# Patient Record
Sex: Female | Born: 1970
Health system: Southern US, Community
[De-identification: ages and names within clinical notes are randomized; demographics above are authoritative.]

## PROBLEM LIST (undated history)

## (undated) DIAGNOSIS — R519 Headache, unspecified: Secondary | ICD-10-CM

## (undated) DIAGNOSIS — B009 Herpesviral infection, unspecified: Secondary | ICD-10-CM

## (undated) DIAGNOSIS — M7072 Other bursitis of hip, left hip: Secondary | ICD-10-CM

## (undated) DIAGNOSIS — E559 Vitamin D deficiency, unspecified: Secondary | ICD-10-CM

## (undated) DIAGNOSIS — E538 Deficiency of other specified B group vitamins: Secondary | ICD-10-CM

## (undated) DIAGNOSIS — M2550 Pain in unspecified joint: Secondary | ICD-10-CM

## (undated) DIAGNOSIS — R748 Abnormal levels of other serum enzymes: Secondary | ICD-10-CM

## (undated) DIAGNOSIS — R51 Headache: Secondary | ICD-10-CM

## (undated) DIAGNOSIS — M7071 Other bursitis of hip, right hip: Secondary | ICD-10-CM

## (undated) DIAGNOSIS — K769 Liver disease, unspecified: Secondary | ICD-10-CM

## (undated) DIAGNOSIS — D649 Anemia, unspecified: Secondary | ICD-10-CM

## (undated) DIAGNOSIS — M797 Fibromyalgia: Secondary | ICD-10-CM

## (undated) HISTORY — DX: Pain in unspecified joint: M25.50

## (undated) HISTORY — DX: Abnormal levels of other serum enzymes: R74.8

## (undated) HISTORY — PX: NO PAST SURGERIES: SHX2092

## (undated) HISTORY — DX: Vitamin D deficiency, unspecified: E55.9

## (undated) HISTORY — DX: Liver disease, unspecified: K76.9

## (undated) HISTORY — DX: Deficiency of other specified B group vitamins: E53.8

## (undated) HISTORY — DX: Other bursitis of hip, right hip: M70.72

## (undated) HISTORY — DX: Fibromyalgia: M79.7

## (undated) HISTORY — DX: Other bursitis of hip, right hip: M70.71

---

## 1999-01-14 ENCOUNTER — Other Ambulatory Visit: Admission: RE | Admit: 1999-01-14 | Discharge: 1999-01-14 | Payer: Self-pay | Admitting: Obstetrics and Gynecology

## 1999-03-29 ENCOUNTER — Emergency Department (HOSPITAL_COMMUNITY): Admission: EM | Admit: 1999-03-29 | Discharge: 1999-03-29 | Payer: Self-pay | Admitting: Emergency Medicine

## 2000-04-02 ENCOUNTER — Encounter (INDEPENDENT_AMBULATORY_CARE_PROVIDER_SITE_OTHER): Payer: Self-pay | Admitting: Specialist

## 2000-04-02 ENCOUNTER — Other Ambulatory Visit: Admission: RE | Admit: 2000-04-02 | Discharge: 2000-04-02 | Payer: Self-pay | Admitting: *Deleted

## 2002-10-23 ENCOUNTER — Encounter: Admission: RE | Admit: 2002-10-23 | Discharge: 2002-10-23 | Payer: Self-pay | Admitting: *Deleted

## 2002-10-23 ENCOUNTER — Encounter: Payer: Self-pay | Admitting: *Deleted

## 2002-11-09 ENCOUNTER — Ambulatory Visit (HOSPITAL_COMMUNITY): Admission: RE | Admit: 2002-11-09 | Discharge: 2002-11-09 | Payer: Self-pay | Admitting: *Deleted

## 2002-11-16 ENCOUNTER — Ambulatory Visit (HOSPITAL_COMMUNITY): Admission: RE | Admit: 2002-11-16 | Discharge: 2002-11-16 | Payer: Self-pay | Admitting: *Deleted

## 2002-11-16 ENCOUNTER — Encounter: Payer: Self-pay | Admitting: *Deleted

## 2005-07-14 ENCOUNTER — Other Ambulatory Visit: Admission: RE | Admit: 2005-07-14 | Discharge: 2005-07-14 | Payer: Self-pay | Admitting: Family Medicine

## 2007-03-08 ENCOUNTER — Emergency Department (HOSPITAL_COMMUNITY): Admission: EM | Admit: 2007-03-08 | Discharge: 2007-03-08 | Payer: Self-pay | Admitting: *Deleted

## 2007-10-07 ENCOUNTER — Ambulatory Visit: Payer: Self-pay | Admitting: Internal Medicine

## 2007-10-07 LAB — CONVERTED CEMR LAB
ALT: 17 units/L (ref 0–35)
AST: 20 units/L (ref 0–37)
Albumin: 3.8 g/dL (ref 3.5–5.2)
Alkaline Phosphatase: 28 units/L — ABNORMAL LOW (ref 39–117)
Amylase: 63 units/L (ref 27–131)
BUN: 10 mg/dL (ref 6–23)
Basophils Absolute: 0 10*3/uL (ref 0.0–0.1)
Basophils Relative: 0 % (ref 0.0–1.0)
Bilirubin, Direct: 0.1 mg/dL (ref 0.0–0.3)
CO2: 31 meq/L (ref 19–32)
Calcium: 9 mg/dL (ref 8.4–10.5)
Chloride: 106 meq/L (ref 96–112)
Creatinine, Ser: 0.9 mg/dL (ref 0.4–1.2)
Eosinophils Absolute: 0 10*3/uL (ref 0.0–0.6)
Eosinophils Relative: 0.6 % (ref 0.0–5.0)
GFR calc Af Amer: 92 mL/min
GFR calc non Af Amer: 76 mL/min
Glucose, Bld: 106 mg/dL — ABNORMAL HIGH (ref 70–99)
HCT: 36.5 % (ref 36.0–46.0)
Hemoglobin: 12.1 g/dL (ref 12.0–15.0)
Lipase: 24 units/L (ref 11.0–59.0)
Lymphocytes Relative: 42.6 % (ref 12.0–46.0)
MCHC: 33.2 g/dL (ref 30.0–36.0)
MCV: 96.2 fL (ref 78.0–100.0)
Monocytes Absolute: 0.5 10*3/uL (ref 0.2–0.7)
Monocytes Relative: 9.4 % (ref 3.0–11.0)
Neutro Abs: 2.8 10*3/uL (ref 1.4–7.7)
Neutrophils Relative %: 47.4 % (ref 43.0–77.0)
Platelets: 308 10*3/uL (ref 150–400)
Potassium: 4.1 meq/L (ref 3.5–5.1)
RBC: 3.79 M/uL — ABNORMAL LOW (ref 3.87–5.11)
RDW: 11.6 % (ref 11.5–14.6)
Sodium: 140 meq/L (ref 135–145)
Total Bilirubin: 0.6 mg/dL (ref 0.3–1.2)
Total Protein: 6.6 g/dL (ref 6.0–8.3)
WBC: 5.8 10*3/uL (ref 4.5–10.5)

## 2007-10-11 ENCOUNTER — Ambulatory Visit (HOSPITAL_COMMUNITY): Admission: RE | Admit: 2007-10-11 | Discharge: 2007-10-11 | Payer: Self-pay | Admitting: Internal Medicine

## 2007-12-22 ENCOUNTER — Ambulatory Visit (HOSPITAL_COMMUNITY): Admission: RE | Admit: 2007-12-22 | Discharge: 2007-12-22 | Payer: Self-pay | Admitting: General Surgery

## 2007-12-22 ENCOUNTER — Encounter (INDEPENDENT_AMBULATORY_CARE_PROVIDER_SITE_OTHER): Payer: Self-pay | Admitting: General Surgery

## 2007-12-22 ENCOUNTER — Encounter: Payer: Self-pay | Admitting: Internal Medicine

## 2010-11-25 ENCOUNTER — Inpatient Hospital Stay (INDEPENDENT_AMBULATORY_CARE_PROVIDER_SITE_OTHER)
Admission: RE | Admit: 2010-11-25 | Discharge: 2010-11-25 | Disposition: A | Discharge status: Home / Self Care | Payer: 59 | Source: Ambulatory Visit | Attending: Family Medicine | Admitting: Family Medicine

## 2010-11-25 DIAGNOSIS — J309 Allergic rhinitis, unspecified: Secondary | ICD-10-CM

## 2010-11-25 DIAGNOSIS — J019 Acute sinusitis, unspecified: Secondary | ICD-10-CM

## 2011-01-06 NOTE — Assessment & Plan Note (Signed)
[facility]                         GASTROENTEROLOGY OFFICE NOTE   NAME:Kayla [name], Kayla [name]                        MRN:          [id number]  DATE:10/07/2007                            DOB:          17-Aug-1971    CHIEF COMPLAINT:  Abdominal pain.  History of gall stones.   HISTORY:  This is a 40 year old African-American woman who describes  intermittent epigastric pain.  It is episodic.  Any type of food will  bother her.  The pain may radiate to the infrascapular area at times.  It is not associated with heartburn, indigestion, nausea or vomiting, or  dysphagia, or bowel habits changes.  She had a workup for a similar  problem, if not the same problem, in 2004 when Dr. Virginia Rochester performed an EGD  that was normal.  She had cholelithiasis on an ultrasound in 2004.  She  is now taking numerous ibuprofen in a week to help with this pain, and  it is probably not relieving it completely.  She describes the pain  usually as a sharp cramping pain that is fairly intense.   MEDICATIONS:  Only the ibuprofen intermittently.   There are no known drug allergies.   PAST MEDICAL HISTORY:  As described above.   FAMILY HISTORY/SOCIAL HISTORY/REVIEW OF SYSTEMS:  Occasional alcohol.  No tobacco or drugs.  She is single with 2 daughters.  Review of systems  negative.   PHYSICAL EXAMINATION:  Well-developed, well-nourished overweight to  obese.  Height 5 feet 1 inches, weight 171 pounds, blood pressure 120/80, pulse  70.  EYES:  Anicteric.  ENT:  Normal mouth posterior oropharynx.  NECK:  Supple.  No thyromegaly or mass.  CHEST:  Clear.  HEART:  S1, S2.  No murmurs, rubs, or gallops.  ABDOMEN:  She has mild to moderate tenderness in the epigastrium without  rebound or mass.  LYMPHATICS:  No neck or supraclavicular nodes.  EXTREMITIES:  No edema.  SKIN:  No rash.  PSYCH:  She is alert and oriented x3.  Appropriate affect.  NEURO:  Cranial nerves 2-12 are intact.   ASSESSMENT:  Epigastric pain.  It is not classic for gall stones, but  she does have known cholelithiasis.  The question now is if she has  choledocholithiasis.  On top of this is significant NSAID use if not  abuse.  She has not had melena or bleeding problems.   PLAN:  1. Stop ibuprofen.  2. A limited supply of Vicodin is given for her pain.  3. Proton pump inhibitor empirically.  4. Ultrasound to recheck for possible choledocholithiasis.  5. Go ahead with surgical referral for this lady as she does have gall      stones.  6. I have held off on upper gastrointestinal endoscopy at this time,      as I am not sure it will add anything.  She could have NSAID      ulceration problems, et Karie Soda.  However, these are the same      problems she had in 2004 and would like the surgeons to evaluate  her to see if cholecystectomy makes sense, but will defer to their      judgment, and could perform an upper endoscopy if there are other      questions.     Iva Boop, MD,FACG  Electronically Signed    CEG/MedQ  DD: 10/11/2007  DT: 10/11/2007  Job #: [id number]   cc:   [facility]

## 2011-01-06 NOTE — Op Note (Signed)
NAMEJANNETT, SCHMALL               ACCOUNT NO.:  [id number]   MEDICAL RECORD NO.:  [id number]          PATIENT TYPE:  AMB   LOCATION:  SDS                          FACILITY:  [facility]   PHYSICIAN:  Cherylynn Ridges, M.D.    DATE OF BIRTH:  1971-05-28   DATE OF PROCEDURE:  12/22/2007  DATE OF DISCHARGE:  12/22/2007                               OPERATIVE REPORT   PREOPERATIVE DIAGNOSIS:  Symptomatic cholelithiasis.   POSTOPERATIVE DIAGNOSIS:  Symptomatic cholelithiasis.   PROCEDURE:  Laparoscopic cholecystectomy.   SURGEON:  Cherylynn Ridges, MD   ASSISTANT:  Clarisa Fling, NP   ANESTHESIA:  General endotracheal.   ESTIMATED BLOOD LOSS:  Less than 30 mL.   COMPLICATIONS:  None.   CONDITION:  Stable.   FINDINGS:  The patient had a gallbladder packed with hundreds of small  pigmented stones.  Normal cholangiogram.   INDICATIONS FOR OPERATION:  The patient is a 41 year old with  symptomatic gallstones who comes in now for laparoscopic  cholecystectomy.   OPERATION:  The patient was taken to the operating room, placed on table  in supine position.  After an adequate general endotracheal anesthetic  was administered, she was prepped and draped in usual sterile manner  exposing the midline and the right upper quadrant of her abdomen.   A supraumbilical curvilinear incision was made using a #11 blade and  taken down to the midline fascia.  The fascia was grabbed with 2 Kocher  clamps and an incision made between the clamps using a 15 blade.  This  allowed [place] to incise into the anterior fascia into the preperitoneal  space.  We subsequently bluntly dissected down into the peritoneal  cavity using a Kelly clamp as we tented up on the abdominal wall with  the Kocher clamps.  Once entering the peritoneal cavity safely, a  pursestring suture of 0 Vicryl was passed around the fascial opening and  then subsequently a Hasson cannula passed into the peritoneal cavity  with minimal difficulty.   It was secured in place with a pursestring  suture.   Carbon dioxide gas was insufflated through the supraumbilical cannula up  to a maximal intra-abdominal pressure of 50 mmHg.  Once this was done, 2  right upper quadrant 5-mm cannulas and a subxiphoid 11-12 mm cannula  passed under direct vision into the peritoneal cavity.  After all  cannulas were in place, the patient was placed in reverse Trendelenburg,  the left-side was tilted down, and the dissection begun.   The gallbladder dome was grabbed with a ratcheted grasper through the  lateral-most cannula and retracted towards the anterior abdominal wall.  A second grasper was placed on the infundibulum.  We dissected out the  peritoneum overlying the triangle of Fallot and hepatobiliary triangle.  We dissected out the cystic duct and the cystic artery placing a clip  along the gallbladder side and cystic duct and making a  cholecystogastrostomy using laparoscopic scissors.  Cholangiogram was  performed using a Cook catheter, which had been passed through the  anterior abdominal wall demonstrating good flow into the duodenum, no  intraductal filling defects, no dilatation of the common bile duct, and  good proximal flow.   Once the cholangiogram was completed, the clip was secured in place was  removed, the catheter was removed, and then we clipped the distal cystic  duct x3 using laparoscopic clips.  We transected the cystic duct, then  isolated the cystic artery, controlled it proximally and distally with  clips, and then transected with electrocautery.  We then dissected out  the gallbladder from its bed with minimal difficulty, not needing to use  an EndoCatch bag to bring it out through the supraumbilical fascial  site.   Once the gallbladder was removed, we inspected the bed which was  cauterized for control of bleeding.  There was minimal bleeding.  We  irrigated with about 750 L of saline solution.  We then aspirated all   fluid and gas from above the liver.  As this was done, we looked for  adequate hemostasis and there was.  We aspirated all fluid and gas from  above the liver as we removed all cannulas.   The supraumbilical fascial site was closed using a pursestring suture  which was in place.  We then injected 0.25% Marcaine with epinephrine at  all sites and then closed the supraumbilical and subxiphoid skin sites  using a running subcuticular stitch of 4-0 Vicryl.  Dermabond, Steri-  Strips, and Tegaderm was applied to all of the wounds.  All needle  counts, sponge counts, and instrument counts were correct.      Cherylynn Ridges, M.D.  Electronically Signed     JOW/MEDQ  D:  12/22/2007  T:  12/23/2007  Job:  [id number]   cc:   Iva Boop, MD,FACG

## 2011-01-09 NOTE — Op Note (Signed)
   NAMELAKETTA, SODERBERG                           ACCOUNT NO.:  [id number]   MEDICAL RECORD NO.:  [id number]                   PATIENT TYPE:  AMB   LOCATION:  ENDO                                 FACILITY:  [facility]   PHYSICIAN:  Georgiana Spinner, M.D.                 DATE OF BIRTH:  1971-07-27   DATE OF PROCEDURE:  11/09/2002  DATE OF DISCHARGE:                                 OPERATIVE REPORT   PROCEDURE:  Upper endoscopy.   INDICATIONS:  Abdominal pain.   ANESTHESIA:  Demerol 30 mg, Versed 3 mg.   DESCRIPTION OF PROCEDURE:  With the patient mildly sedated in the left  lateral decubitus position, the Olympus videoscopic endoscope was inserted  in the mouth, passed under direct vision through the esophagus, which  appeared normal, into the stomach.  Fundus, body, antrum, duodenal bulb,  second portion of the duodenum all appeared normal.  From this point the  endoscope was slowly withdrawn, taking circumferential views of the duodenal  mucosa until the endoscope had been pulled back in the stomach, placed in  retroflexion to view the stomach from below.  The endoscope was then  straightened and withdrawn, taking circumferential views of the remaining  gastric and esophageal mucosa.  The patient's vital signs and pulse oximetry  remained stable.  The patient tolerated the procedure well without apparent  complications.   FINDINGS:  Unremarkable examination.   PLAN:  It did appear that the patient had decreased peristalsis.  Will plan  a gastric emptying study and have the patient follow up with me as an  outpatient.                                               Georgiana Spinner, M.D.    GMO/MEDQ  D:  11/09/2002  T:  11/09/2002  Job:  [id number]

## 2011-05-19 LAB — DIFFERENTIAL
Basophils Relative: 0
Eosinophils Absolute: 0
Neutro Abs: 2.3
Neutrophils Relative %: 48

## 2011-05-19 LAB — COMPREHENSIVE METABOLIC PANEL
ALT: 16
Alkaline Phosphatase: 30 — ABNORMAL LOW
BUN: 9
CO2: 28
Calcium: 9
GFR calc non Af Amer: >60
Glucose, Bld: 86
Potassium: 3.6
Sodium: 140

## 2011-05-19 LAB — CBC
HCT: 35.9 — ABNORMAL LOW
Hemoglobin: 12.4
MCHC: 34.7
RBC: 3.75 — ABNORMAL LOW
RDW: 12.2

## 2014-03-29 ENCOUNTER — Other Ambulatory Visit: Payer: Self-pay | Admitting: Obstetrics and Gynecology

## 2014-03-30 LAB — CYTOLOGY - PAP

## 2015-04-08 ENCOUNTER — Other Ambulatory Visit: Payer: Self-pay | Admitting: Obstetrics and Gynecology

## 2015-04-09 LAB — CYTOLOGY - PAP

## 2017-02-26 ENCOUNTER — Emergency Department (HOSPITAL_COMMUNITY)
Admission: EM | Admit: 2017-02-26 | Discharge: 2017-02-26 | Disposition: A | Discharge status: Home / Self Care | Payer: 59 | Attending: Emergency Medicine | Admitting: Emergency Medicine

## 2017-02-26 ENCOUNTER — Encounter (HOSPITAL_COMMUNITY): Payer: Self-pay | Admitting: *Deleted

## 2017-02-26 ENCOUNTER — Emergency Department (HOSPITAL_COMMUNITY): Payer: 59

## 2017-02-26 DIAGNOSIS — Y939 Activity, unspecified: Secondary | ICD-10-CM | POA: Diagnosis not present

## 2017-02-26 DIAGNOSIS — Y92410 Unspecified street and highway as the place of occurrence of the external cause: Secondary | ICD-10-CM | POA: Diagnosis not present

## 2017-02-26 DIAGNOSIS — Y998 Other external cause status: Secondary | ICD-10-CM | POA: Insufficient documentation

## 2017-02-26 DIAGNOSIS — S199XXA Unspecified injury of neck, initial encounter: Secondary | ICD-10-CM | POA: Diagnosis present

## 2017-02-26 DIAGNOSIS — S39012A Strain of muscle, fascia and tendon of lower back, initial encounter: Secondary | ICD-10-CM | POA: Diagnosis not present

## 2017-02-26 DIAGNOSIS — S161XXA Strain of muscle, fascia and tendon at neck level, initial encounter: Secondary | ICD-10-CM | POA: Insufficient documentation

## 2017-02-26 MED ORDER — NAPROXEN 500 MG PO TABS: 500.0000 mg | ORAL_TABLET | Freq: Two times a day (BID) | ORAL | 0 refills | Status: DC

## 2017-02-26 MED ORDER — METHOCARBAMOL 500 MG PO TABS: 500.0000 mg | ORAL_TABLET | Freq: Every evening | ORAL | 0 refills | Status: DC | PRN
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
## 2017-02-26 NOTE — ED Notes (Signed)
Tonia Ghent, NT placed pt in c collar

## 2017-02-26 NOTE — ED Notes (Signed)
Patient transported to X-ray 
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
## 2017-03-02 DIAGNOSIS — N921 Excessive and frequent menstruation with irregular cycle: Secondary | ICD-10-CM | POA: Diagnosis not present

## 2017-03-02 DIAGNOSIS — Z87312 Personal history of (healed) stress fracture: Secondary | ICD-10-CM | POA: Diagnosis not present

## 2017-03-22 ENCOUNTER — Other Ambulatory Visit (HOSPITAL_COMMUNITY): Payer: Self-pay | Admitting: Internal Medicine

## 2017-03-22 DIAGNOSIS — Z0000 Encounter for general adult medical examination without abnormal findings: Secondary | ICD-10-CM | POA: Diagnosis not present

## 2017-03-22 DIAGNOSIS — R5383 Other fatigue: Secondary | ICD-10-CM | POA: Diagnosis not present

## 2017-03-22 DIAGNOSIS — D649 Anemia, unspecified: Secondary | ICD-10-CM | POA: Diagnosis not present

## 2017-03-22 DIAGNOSIS — R935 Abnormal findings on diagnostic imaging of other abdominal regions, including retroperitoneum: Secondary | ICD-10-CM | POA: Diagnosis not present

## 2017-03-26 ENCOUNTER — Ambulatory Visit (HOSPITAL_COMMUNITY)
Admission: RE | Admit: 2017-03-26 | Discharge: 2017-03-26 | Disposition: A | Discharge status: Home / Self Care | Payer: 59 | Source: Ambulatory Visit | Attending: Internal Medicine | Admitting: Internal Medicine

## 2017-03-26 ENCOUNTER — Encounter (HOSPITAL_COMMUNITY)
Admission: RE | Admit: 2017-03-26 | Discharge: 2017-03-26 | Disposition: A | Discharge status: Home / Self Care | Payer: 59 | Source: Ambulatory Visit | Attending: Internal Medicine | Admitting: Internal Medicine

## 2017-03-26 DIAGNOSIS — R935 Abnormal findings on diagnostic imaging of other abdominal regions, including retroperitoneum: Secondary | ICD-10-CM | POA: Diagnosis not present

## 2017-03-26 DIAGNOSIS — G35 Multiple sclerosis: Secondary | ICD-10-CM | POA: Insufficient documentation

## 2017-03-26 DIAGNOSIS — M898X8 Other specified disorders of bone, other site: Secondary | ICD-10-CM | POA: Diagnosis not present

## 2017-03-26 MED ORDER — TECHNETIUM TC 99M MEDRONATE IV KIT
25.0000 | PACK | Freq: Once | INTRAVENOUS | Status: AC | PRN
  Administered 2017-03-26: 25 via INTRAVENOUS

## 2017-04-28 DIAGNOSIS — Z01419 Encounter for gynecological examination (general) (routine) without abnormal findings: Secondary | ICD-10-CM | POA: Diagnosis not present

## 2017-05-06 DIAGNOSIS — Z23 Encounter for immunization: Secondary | ICD-10-CM | POA: Diagnosis not present

## 2017-05-06 DIAGNOSIS — D649 Anemia, unspecified: Secondary | ICD-10-CM | POA: Diagnosis not present

## 2017-05-06 DIAGNOSIS — D500 Iron deficiency anemia secondary to blood loss (chronic): Secondary | ICD-10-CM | POA: Diagnosis not present

## 2017-05-17 DIAGNOSIS — N924 Excessive bleeding in the premenopausal period: Secondary | ICD-10-CM | POA: Diagnosis not present

## 2017-08-09 DIAGNOSIS — N920 Excessive and frequent menstruation with regular cycle: Secondary | ICD-10-CM | POA: Diagnosis not present

## 2017-08-09 DIAGNOSIS — D649 Anemia, unspecified: Secondary | ICD-10-CM | POA: Diagnosis not present

## 2017-10-05 DIAGNOSIS — D649 Anemia, unspecified: Secondary | ICD-10-CM | POA: Diagnosis not present

## 2017-11-03 NOTE — Consult Note (Signed)
Kayla, [name]               ACCOUNT NO.:  [id number]  MEDICAL RECORD NO.:  [id number]  LOCATION:                                 FACILITY:  PHYSICIAN:  Ralene Bathe. Matthew Saras, M.D.DATE OF BIRTH:  [birth year]  DATE OF CONSULTATION: DATE OF DISCHARGE:                                CONSULTATION   CHIEF COMPLAINT:  Symptomatic leiomyoma, menorrhagia with anemia.  HISTORY OF PRESENT ILLNESS:  A 47 year old, G2, P2, currently on __________ has been evaluated and treated for iron-deficiency anemia, her bleeding has not responded well to OCPs, and she presents at this time for definitive LAVH and bilateral salpingectomy.  Last ultrasound on September 28 demonstrated a 5.4 x 4.6, solitary fibroid intramural posterior, otherwise exam unremarkable.  This procedure including specific risks regarding bleeding, infection, transfusion, adjacent organ injury, wound infection, phlebitis, the possible need to complete surgery by open technique along with her expected recovery time discussed which she understands and accepts.  ALLERGIES:  None.  CURRENT MEDICATIONS:  OCPs and iron.  PAST SURGICAL HISTORY:  Cholecystectomy, 2 vaginal deliveries.  FAMILY HISTORY:  Otherwise unremarkable.  SOCIAL HISTORY:  Denies alcohol, tobacco, or drug use.  She is single. Dr. Angelica [name] is her medical doctor.  Last Pap in May 2018 was normal.  PHYSICAL EXAMINATION:  VITAL SIGNS:  Temp 98.2, blood pressure 120/78. HEENT:  Unremarkable. NECK:  Supple without masses. LUNGS:  Clear. CARDIOVASCULAR:  Regular rate and rhythm without murmurs, rubs, or gallops noted. BREASTS:  Without masses. ABDOMEN:  Soft, flat, nontender. PELVIC:  Vulva, vagina, cervix normal.  Uterus 10 weeks' size, irregular to the posterior side.  Adnexa negative. EXTREMITIES:  Unremarkable. NEUROLOGIC:  Unremarkable.  IMPRESSION:  Symptomatic leiomyoma with anemia.  PLAN:  LAVH and bilateral salpingectomy.  Procedure and risks  discussed as above.     Kayla [name] M. Matthew Saras, M.D.     RMH/MEDQ  D:  11/02/2017  T:  11/02/2017  Job:  [id number]

## 2017-11-05 NOTE — Patient Instructions (Addendum)
Your procedure is scheduled on:  Monday, April 1  Enter through the [employer] of [facility] at: 6 am  Pick up the phone at the desk and dial [phone].  Call this number if you have problems the morning of surgery: [phone].  Remember: Do NOT eat or Do NOT drink clear liquids (including water) after midnight Sunday  Take these medicines the morning of surgery with a SIP OF WATER:  None  Do NOT wear jewelry (body piercing), metal hair clips/bobby pins, make-up, or nail polish. Do NOT wear lotions, powders, or perfumes.  You may wear deoderant. Do NOT shave for 48 hours prior to surgery. Do NOT bring valuables to the hospital.  Leave suitcase in car.  After surgery it may be brought to your room.  For patients admitted to the hospital, checkout time is 11:00 AM the day of discharge. Have a responsible adult drive you home and stay with you for 24 hours after your procedure.  Home with [employer] cell [phone].

## 2017-11-10 ENCOUNTER — Encounter (HOSPITAL_COMMUNITY): Payer: Self-pay

## 2017-11-10 ENCOUNTER — Encounter (HOSPITAL_COMMUNITY)
Admission: RE | Admit: 2017-11-10 | Discharge: 2017-11-10 | Disposition: A | Discharge status: Home / Self Care | Payer: 59 | Source: Ambulatory Visit | Attending: Obstetrics and Gynecology | Admitting: Obstetrics and Gynecology

## 2017-11-10 ENCOUNTER — Other Ambulatory Visit: Payer: Self-pay

## 2017-11-10 DIAGNOSIS — Z01812 Encounter for preprocedural laboratory examination: Secondary | ICD-10-CM | POA: Diagnosis not present

## 2017-11-10 HISTORY — DX: Herpesviral infection, unspecified: B00.9

## 2017-11-10 HISTORY — DX: Headache, unspecified: R51.9

## 2017-11-10 HISTORY — DX: Headache: R51

## 2017-11-10 HISTORY — DX: Anemia, unspecified: D64.9

## 2017-11-10 LAB — TYPE AND SCREEN
ABO/RH(D): AB POS
ANTIBODY SCREEN: NEGATIVE

## 2017-11-10 LAB — CBC
HCT: 31 % — ABNORMAL LOW (ref 36.0–46.0)
Hemoglobin: 9.9 g/dL — ABNORMAL LOW (ref 12.0–15.0)
MCH: 29.5 pg (ref 26.0–34.0)
MCHC: 31.9 g/dL (ref 30.0–36.0)
MCV: 92.3 fL (ref 78.0–100.0)
PLATELETS: 328 10*3/uL (ref 150–400)
RBC: 3.36 MIL/uL — ABNORMAL LOW (ref 3.87–5.11)
RDW: 14.1 % (ref 11.5–15.5)
WBC: 5.7 10*3/uL (ref 4.0–10.5)

## 2017-11-10 LAB — ABO/RH: ABO/RH(D): AB POS

## 2017-11-11 DIAGNOSIS — D649 Anemia, unspecified: Secondary | ICD-10-CM | POA: Diagnosis not present

## 2017-11-15 ENCOUNTER — Inpatient Hospital Stay (HOSPITAL_COMMUNITY)
Admission: AD | Admit: 2017-11-15 | Discharge: 2017-11-15 | Disposition: A | Discharge status: Home / Self Care | Payer: 59 | Source: Ambulatory Visit | Attending: Obstetrics and Gynecology | Admitting: Obstetrics and Gynecology

## 2017-11-15 ENCOUNTER — Encounter (HOSPITAL_COMMUNITY): Payer: Self-pay

## 2017-11-15 DIAGNOSIS — N939 Abnormal uterine and vaginal bleeding, unspecified: Secondary | ICD-10-CM

## 2017-11-15 DIAGNOSIS — D62 Acute posthemorrhagic anemia: Secondary | ICD-10-CM | POA: Diagnosis not present

## 2017-11-15 LAB — POCT PREGNANCY, URINE: PREG TEST UR: NEGATIVE

## 2017-11-15 LAB — CBC
HCT: 28.2 % — ABNORMAL LOW (ref 36.0–46.0)
Hemoglobin: 9 g/dL — ABNORMAL LOW (ref 12.0–15.0)
MCH: 29.4 pg (ref 26.0–34.0)
MCHC: 31.9 g/dL (ref 30.0–36.0)
MCV: 92.2 fL (ref 78.0–100.0)
Platelets: 294 10*3/uL (ref 150–400)
RBC: 3.06 MIL/uL — ABNORMAL LOW (ref 3.87–5.11)
RDW: 13.9 % (ref 11.5–15.5)
WBC: 6.8 10*3/uL (ref 4.0–10.5)

## 2017-11-15 LAB — URINALYSIS, ROUTINE W REFLEX MICROSCOPIC
BACTERIA UA: NONE SEEN
Bilirubin Urine: NEGATIVE
Glucose, UA: NEGATIVE mg/dL
KETONES UR: 5 mg/dL — AB
Nitrite: NEGATIVE
PROTEIN: 100 mg/dL — AB
Specific Gravity, Urine: 1.031 — ABNORMAL HIGH (ref 1.005–1.030)
WBC UA: NONE SEEN WBC/hpf (ref 0–5)
pH: 5 (ref 5.0–8.0)

## 2017-11-15 MED ORDER — SODIUM CHLORIDE 0.9 % IV SOLN
510.0000 mg | Freq: Once | INTRAVENOUS | Status: AC
  Administered 2017-11-15: 510 mg via INTRAVENOUS
  Filled 2017-11-15: qty 17

## 2017-11-15 NOTE — Discharge Instructions (Signed)
Abnormal Uterine Bleeding Abnormal uterine bleeding means bleeding more than usual from your uterus. It can include:  Bleeding between periods.  Bleeding after sex.  Bleeding that is heavier than normal.  Periods that last longer than usual.  Bleeding after you have stopped having your period (menopause).  There are many problems that may cause this. You should see a doctor for any kind of bleeding that is not normal. Treatment depends on the cause of the bleeding. Follow these instructions at home:  Watch your condition for any changes.  Do not use tampons, douche, or have sex, if your doctor tells you not to.  Change your pads often.  Get regular well-woman exams. Make sure they include a pelvic exam and cervical cancer screening.  Keep all follow-up visits as told by your doctor. This is important. Contact a doctor if:  The bleeding lasts more than one week.  You feel dizzy at times.  You feel like you are going to throw up (nauseous).  You throw up. Get help right away if:  You pass out.  You have to change pads every hour.  You have belly (abdominal) pain.  You have a fever.  You get sweaty.  You get weak.  You passing large blood clots from your vagina. Summary  Abnormal uterine bleeding means bleeding more than usual from your uterus.  There are many problems that may cause this. You should see a doctor for any kind of bleeding that is not normal.  Treatment depends on the cause of the bleeding. This information is not intended to replace advice given to you by your health care provider. Make sure you discuss any questions you have with your health care provider. Document Released: 06/07/2009 Document Revised: 08/04/2016 Document Reviewed: 08/04/2016 Elsevier Interactive Patient Education  2017 Elsevier Inc.   Anemia Anemia is a condition in which you do not have enough red blood cells or hemoglobin. Hemoglobin is a substance in red blood cells  that carries oxygen. When you do not have enough red blood cells or hemoglobin (are anemic), your body cannot get enough oxygen and your organs may not work properly. As a result, you may feel very tired or have other problems. What are the causes? Common causes of anemia include:  Excessive bleeding. Anemia can be caused by excessive bleeding inside or outside the body, including bleeding from the intestine or from periods in women.  Poor nutrition.  Long-lasting (chronic) kidney, thyroid, and liver disease.  Bone marrow disorders.  Cancer and treatments for cancer.  HIV (human immunodeficiency virus) and AIDS (acquired immunodeficiency syndrome).  Treatments for HIV and AIDS.  Spleen problems.  Blood disorders.  Infections, medicines, and autoimmune disorders that destroy red blood cells.  What are the signs or symptoms? Symptoms of this condition include:  Minor weakness.  Dizziness.  Headache.  Feeling heartbeats that are irregular or faster than normal (palpitations).  Shortness of breath, especially with exercise.  Paleness.  Cold sensitivity.  Indigestion.  Nausea.  Difficulty sleeping.  Difficulty concentrating.  Symptoms may occur suddenly or develop slowly. If your anemia is mild, you may not have symptoms. How is this diagnosed? This condition is diagnosed based on:  Blood tests.  Your medical history.  A physical exam.  Bone marrow biopsy.  Your health care provider may also check your stool (feces) for blood and may do additional testing to look for the cause of your bleeding. You may also have other tests, including:  Imaging tests, such  as a CT scan or MRI.  Endoscopy.  Colonoscopy.  How is this treated? Treatment for this condition depends on the cause. If you continue to lose a lot of blood, you may need to be treated at a hospital. Treatment may include:  Taking supplements of iron, vitamin Q72, or folic acid.  Taking a  hormone medicine (erythropoietin) that can help to stimulate red blood cell growth.  Having a blood transfusion. This may be needed if you lose a lot of blood.  Making changes to your diet.  Having surgery to remove your spleen.  Follow these instructions at home:  Take over-the-counter and prescription medicines only as told by your health care provider.  Take supplements only as told by your health care provider.  Follow any diet instructions that you were given.  Keep all follow-up visits as told by your health care provider. This is important. Contact a health care provider if:  You develop new bleeding anywhere in the body. Get help right away if:  You are very weak.  You are short of breath.  You have pain in your abdomen or chest.  You are dizzy or feel faint.  You have trouble concentrating.  You have bloody or black, tarry stools.  You vomit repeatedly or you vomit up blood. Summary  Anemia is a condition in which you do not have enough red blood cells or enough of a substance in your red blood cells that carries oxygen (hemoglobin).  Symptoms may occur suddenly or develop slowly.  If your anemia is mild, you may not have symptoms.  This condition is diagnosed with blood tests as well as a medical history and physical exam. Other tests may be needed.  Treatment for this condition depends on the cause of the anemia. This information is not intended to replace advice given to you by your health care provider. Make sure you discuss any questions you have with your health care provider. Document Released: 09/17/2004 Document Revised: 09/11/2016 Document Reviewed: 09/11/2016 Elsevier Interactive Patient Education  [employer].

## 2017-11-15 NOTE — MAU Provider Note (Signed)
History     CSN: [id number]  Arrival date and time: 11/15/17 1335   First Provider Initiated Contact with Patient 11/15/17 1644      Chief Complaint  Patient presents with  . Vaginal Bleeding   HPI   [name] is a 47 y.o. female non pregnant female here in MAU with heavy vaginal bleeding X 2 days. She is scheduled for LAVH and bilateral salpingectomy next Monday with Dr. Matthew Saras. States she could not wait to be seen. Says she she is soaking through her clothes with blood. Changing her pad more than 1 pad an hour.  No dizziness. Says she is taking Iron pills, and BC pills. Last Wednesdays her Hgb was 9.9.  She spoke to a nurse at [facility] who recommenced she come into MAU.   OB History    Gravida  2   Para  2   Term  2   Preterm      AB      Living        SAB      TAB      Ectopic      Multiple      Live Births              Past Medical History:  Diagnosis Date  . Anemia   . Headache    otc med prn - trigger "stress"  . HSV infection   . SVD (spontaneous vaginal delivery)    x 2    Past Surgical History:  Procedure Laterality Date  . NO PAST SURGERIES      History reviewed. No pertinent family history.  Social History   Tobacco Use  . Smoking status: Never Smoker  . Smokeless tobacco: Never Used  Substance Use Topics  . Alcohol use: Yes    Comment: Occasional   . Drug use: No    Allergies: No Known Allergies  Medications Prior to Admission  Medication Sig Dispense Refill Last Dose  . FERREX 150 150 MG capsule Take 150 mg by mouth daily.  1   . Norethin Ace-Eth Estrad-FE (TAYTULLA) 1-20 MG-MCG(24) CAPS Take 1 tablet by mouth daily at 2 PM.      Results for orders placed or performed during the hospital encounter of 11/15/17 (from the past 48 hour(s))  Urinalysis, Routine w reflex microscopic     Status: Abnormal   Collection Time: 11/15/17  2:20 PM  Result Value Ref Range   Color, Urine AMBER (A) YELLOW    Comment: BIOCHEMICALS  MAY BE AFFECTED BY COLOR   APPearance CLOUDY (A) CLEAR   Specific Gravity, Urine 1.031 (H) 1.005 - 1.030   pH 5.0 5.0 - 8.0   Glucose, UA NEGATIVE NEGATIVE mg/dL   Hgb urine dipstick LARGE (A) NEGATIVE   Bilirubin Urine NEGATIVE NEGATIVE   Ketones, ur 5 (A) NEGATIVE mg/dL   Protein, ur 100 (A) NEGATIVE mg/dL   Nitrite NEGATIVE NEGATIVE   Leukocytes, UA TRACE (A) NEGATIVE   RBC / HPF TOO NUMEROUS TO COUNT 0 - 5 RBC/hpf   WBC, UA NONE SEEN 0 - 5 WBC/hpf   Bacteria, UA NONE SEEN NONE SEEN   Squamous Epithelial / LPF 0-5 (A) NONE SEEN   Mucus PRESENT     Comment: Performed at [facility], [address] [place], [place] [address]  Pregnancy, urine POC     Status: None   Collection Time: 11/15/17  2:41 PM  Result Value Ref Range   Preg Test, Ur  NEGATIVE NEGATIVE    Comment:        THE SENSITIVITY OF THIS METHODOLOGY IS >24 mIU/mL   CBC     Status: Abnormal   Collection Time: 11/15/17  4:45 PM  Result Value Ref Range   WBC 6.8 4.0 - 10.5 K/uL   RBC 3.06 (L) 3.87 - 5.11 MIL/uL   Hemoglobin 9.0 (L) 12.0 - 15.0 g/dL   HCT 28.2 (L) 36.0 - 46.0 %   MCV 92.2 78.0 - 100.0 fL   MCH 29.4 26.0 - 34.0 pg   MCHC 31.9 30.0 - 36.0 g/dL   RDW 13.9 11.5 - 15.5 %   Platelets 294 150 - 400 K/uL    Comment: Performed at [facility], [address], [place], [place] [address]    Review of Systems  Constitutional: Negative for fever.  Gastrointestinal: Negative for abdominal pain.  Genitourinary: Positive for pelvic pain and vaginal bleeding.   Physical Exam   Blood pressure 135/78, pulse 61, temperature 98.1 F (36.7 C), temperature source Oral, resp. rate 18, height 5\' 2"  (1.575 m), weight 173 lb (78.5 kg).  Physical Exam  Constitutional: She is oriented to person, place, and time. She appears well-developed and well-nourished. No distress.  HENT:  Head: Normocephalic.  Eyes: Pupils are equal, round, and reactive to light.  GI: Soft. She exhibits distension.  Genitourinary:   Genitourinary Comments: Vagina - Small amount of dark vaginal bleeding, no odor  Cervix - small active bleeding  Bimanual exam: Cervix closed Uterus with tenderness, + enlargement  Adnexa non tender, no masses bilaterally Chaperone present for exam.   Musculoskeletal: Normal range of motion.  Neurological: She is alert and oriented to person, place, and time.  Skin: Skin is warm. She is not diaphoretic.  Psychiatric: Her behavior is normal.    MAU Course  Procedures  None  MDM  Hgb 1 week ago 9.9 Patient declines the need for pain medication Discussed exam and HPI with Dr. Corinna Capra who would like the patient to have an Iron infusion today in MAU. Patient agreeable.   Assessment and Plan   A:  1. Acute blood loss anemia   2. Abnormal vaginal bleeding     P:  Discharge home in stable condition Increase BC pills to 2 pills daily until bleeding slows.  Bleeding precautions Return to MAU if symptoms worsen  Rasch, Artist Pais, NP 11/15/2017 6:51 PM

## 2017-11-15 NOTE — MAU Note (Signed)
Pt has Fibroids has surgery scheduled for Monday. Started  Heavily  Yesterday. Called MD told to come in. Passing golfball sized clots. C/o some cramping with it.

## 2017-11-21 NOTE — Anesthesia Preprocedure Evaluation (Addendum)
Anesthesia Evaluation  Patient identified by MRN, date of birth, ID band Patient awake    Reviewed: Allergy & Precautions, NPO status , Patient's Chart, lab work & pertinent test results  Airway Mallampati: I  TM Distance: >3 FB Neck ROM: Full    Dental no notable dental hx.    Pulmonary neg pulmonary ROS,    Pulmonary exam normal breath sounds clear to auscultation       Cardiovascular negative cardio ROS Normal cardiovascular exam Rhythm:Regular Rate:Normal     Neuro/Psych  Headaches, negative psych ROS   GI/Hepatic negative GI ROS, Neg liver ROS,   Endo/Other  negative endocrine ROS  Renal/GU negative Renal ROS     Musculoskeletal negative musculoskeletal ROS (+)   Abdominal (+) + obese,   Peds  Hematology  (+) anemia ,   Anesthesia Other Findings fibroids  Reproductive/Obstetrics                            Anesthesia Physical Anesthesia Plan  ASA: II  Anesthesia Plan: General   Post-op Pain Management:    Induction: Intravenous  PONV Risk Score and Plan: 3 and Midazolam, Dexamethasone, Ondansetron, Scopolamine patch - Pre-op and Treatment may vary due to age or medical condition  Airway Management Planned: Oral ETT  Additional Equipment:   Intra-op Plan:   Post-operative Plan: Extubation in OR  Informed Consent: I have reviewed the patients History and Physical, chart, labs and discussed the procedure including the risks, benefits and alternatives for the proposed anesthesia with the patient or authorized representative who has indicated his/her understanding and acceptance.   Dental advisory given  Plan Discussed with: CRNA  Anesthesia Plan Comments:         Anesthesia Quick Evaluation

## 2017-11-22 ENCOUNTER — Ambulatory Visit (HOSPITAL_COMMUNITY): Payer: 59 | Admitting: Anesthesiology

## 2017-11-22 ENCOUNTER — Other Ambulatory Visit: Payer: Self-pay

## 2017-11-22 ENCOUNTER — Encounter (HOSPITAL_COMMUNITY)
Admission: AD | Disposition: A | Discharge status: Home / Self Care | Payer: Self-pay | Source: Ambulatory Visit | Attending: Obstetrics and Gynecology

## 2017-11-22 ENCOUNTER — Encounter (HOSPITAL_COMMUNITY): Payer: Self-pay

## 2017-11-22 ENCOUNTER — Inpatient Hospital Stay (HOSPITAL_COMMUNITY)
Admission: AD | Admit: 2017-11-22 | Discharge: 2017-11-23 | DRG: 743 | Disposition: A | Discharge status: Home / Self Care | Payer: 59 | Source: Ambulatory Visit | Attending: Obstetrics and Gynecology | Admitting: Obstetrics and Gynecology

## 2017-11-22 DIAGNOSIS — D259 Leiomyoma of uterus, unspecified: Principal | ICD-10-CM | POA: Diagnosis present

## 2017-11-22 DIAGNOSIS — D219 Benign neoplasm of connective and other soft tissue, unspecified: Secondary | ICD-10-CM | POA: Diagnosis present

## 2017-11-22 DIAGNOSIS — Z5331 Laparoscopic surgical procedure converted to open procedure: Secondary | ICD-10-CM | POA: Diagnosis not present

## 2017-11-22 DIAGNOSIS — D500 Iron deficiency anemia secondary to blood loss (chronic): Secondary | ICD-10-CM | POA: Diagnosis present

## 2017-11-22 DIAGNOSIS — N920 Excessive and frequent menstruation with regular cycle: Secondary | ICD-10-CM | POA: Diagnosis present

## 2017-11-22 DIAGNOSIS — D649 Anemia, unspecified: Secondary | ICD-10-CM | POA: Diagnosis not present

## 2017-11-22 HISTORY — PX: ABDOMINAL HYSTERECTOMY: SHX81

## 2017-11-22 HISTORY — PX: BILATERAL SALPINGECTOMY: SHX5743

## 2017-11-22 HISTORY — PX: LAPAROSCOPIC VAGINAL HYSTERECTOMY WITH SALPINGECTOMY: SHX6680

## 2017-11-22 LAB — PREGNANCY, URINE: Preg Test, Ur: NEGATIVE

## 2017-11-22 SURGERY — HYSTERECTOMY, VAGINAL, LAPAROSCOPY-ASSISTED, WITH SALPINGECTOMY
Anesthesia: General | Laterality: Bilateral

## 2017-11-22 MED ORDER — CELECOXIB 200 MG PO CAPS
200.0000 mg | ORAL_CAPSULE | Freq: Once | ORAL | Status: AC
  Administered 2017-11-22: 200 mg via ORAL

## 2017-11-22 MED ORDER — SUFENTANIL CITRATE 50 MCG/ML IV SOLN: INTRAVENOUS | Status: DC | PRN

## 2017-11-22 MED ORDER — PROPOFOL 10 MG/ML IV BOLUS
INTRAVENOUS | Status: DC | PRN
  Administered 2017-11-22: 200 mg via INTRAVENOUS

## 2017-11-22 MED ORDER — SCOPOLAMINE 1 MG/3DAYS TD PT72
1.0000 | MEDICATED_PATCH | TRANSDERMAL | Status: DC
  Administered 2017-11-22: 1.5 mg via TRANSDERMAL

## 2017-11-22 MED ORDER — SCOPOLAMINE 1 MG/3DAYS TD PT72
MEDICATED_PATCH | TRANSDERMAL | Status: AC
  Filled 2017-11-22: qty 1

## 2017-11-22 MED ORDER — CEFOTETAN DISODIUM-DEXTROSE 2-2.08 GM-%(50ML) IV SOLR
INTRAVENOUS | Status: AC
  Filled 2017-11-22: qty 50

## 2017-11-22 MED ORDER — SUGAMMADEX SODIUM 200 MG/2ML IV SOLN
INTRAVENOUS | Status: DC | PRN
  Administered 2017-11-22: 200 mg via INTRAVENOUS

## 2017-11-22 MED ORDER — MENTHOL 3 MG MT LOZG: 1.0000 | LOZENGE | OROMUCOSAL | Status: DC | PRN

## 2017-11-22 MED ORDER — KETOROLAC TROMETHAMINE 30 MG/ML IJ SOLN
30.0000 mg | Freq: Once | INTRAMUSCULAR | Status: AC
  Administered 2017-11-22: 30 mg via INTRAVENOUS
  Filled 2017-11-22: qty 1

## 2017-11-22 MED ORDER — ONDANSETRON HCL 4 MG/2ML IJ SOLN
INTRAMUSCULAR | Status: DC | PRN
  Administered 2017-11-22: 4 mg via INTRAVENOUS

## 2017-11-22 MED ORDER — BUPIVACAINE HCL (PF) 0.25 % IJ SOLN
INTRAMUSCULAR | Status: DC | PRN
  Administered 2017-11-22: 7 mL

## 2017-11-22 MED ORDER — ONDANSETRON HCL 4 MG/2ML IJ SOLN: 4.0000 mg | Freq: Four times a day (QID) | INTRAMUSCULAR | Status: DC | PRN

## 2017-11-22 MED ORDER — SODIUM CHLORIDE 0.9 % IJ SOLN
INTRAMUSCULAR | Status: DC | PRN
  Administered 2017-11-22: 10 mL

## 2017-11-22 MED ORDER — HYDROMORPHONE HCL 1 MG/ML IJ SOLN
INTRAMUSCULAR | Status: AC
  Administered 2017-11-22: 0.5 mg via INTRAVENOUS
  Filled 2017-11-22: qty 0.5

## 2017-11-22 MED ORDER — LACTATED RINGERS IV SOLN
INTRAVENOUS | Status: DC
  Administered 2017-11-22: 09:00:00 via INTRAVENOUS
  Administered 2017-11-22: 125 mL/h via INTRAVENOUS

## 2017-11-22 MED ORDER — OXYCODONE HCL 5 MG PO TABS: 5.0000 mg | ORAL_TABLET | Freq: Once | ORAL | Status: DC | PRN

## 2017-11-22 MED ORDER — NALOXONE HCL 0.4 MG/ML IJ SOLN: 0.4000 mg | INTRAMUSCULAR | Status: DC | PRN

## 2017-11-22 MED ORDER — ONDANSETRON HCL 4 MG PO TABS: 4.0000 mg | ORAL_TABLET | Freq: Four times a day (QID) | ORAL | Status: DC | PRN

## 2017-11-22 MED ORDER — ACETAMINOPHEN 500 MG PO TABS
1000.0000 mg | ORAL_TABLET | Freq: Once | ORAL | Status: AC
  Administered 2017-11-22: 1000 mg via ORAL

## 2017-11-22 MED ORDER — MIDAZOLAM HCL 2 MG/2ML IJ SOLN
INTRAMUSCULAR | Status: DC | PRN
  Administered 2017-11-22: 2 mg via INTRAVENOUS

## 2017-11-22 MED ORDER — IBUPROFEN 800 MG PO TABS
800.0000 mg | ORAL_TABLET | Freq: Three times a day (TID) | ORAL | Status: DC | PRN
  Administered 2017-11-23: 800 mg via ORAL
  Filled 2017-11-22: qty 1

## 2017-11-22 MED ORDER — ROCURONIUM BROMIDE 100 MG/10ML IV SOLN
INTRAVENOUS | Status: DC | PRN
  Administered 2017-11-22: 10 mg via INTRAVENOUS
  Administered 2017-11-22: 40 mg via INTRAVENOUS

## 2017-11-22 MED ORDER — KETOROLAC TROMETHAMINE 30 MG/ML IJ SOLN
30.0000 mg | Freq: Four times a day (QID) | INTRAMUSCULAR | Status: DC
  Administered 2017-11-22 – 2017-11-23 (×3): 30 mg via INTRAVENOUS
  Filled 2017-11-22 (×3): qty 1

## 2017-11-22 MED ORDER — OXYCODONE HCL 5 MG/5ML PO SOLN: 5.0000 mg | Freq: Once | ORAL | Status: DC | PRN

## 2017-11-22 MED ORDER — FENTANYL CITRATE (PF) 250 MCG/5ML IJ SOLN
INTRAMUSCULAR | Status: AC
  Filled 2017-11-22: qty 5

## 2017-11-22 MED ORDER — LIDOCAINE HCL (CARDIAC) 20 MG/ML IV SOLN
INTRAVENOUS | Status: DC | PRN
  Administered 2017-11-22: 100 mg via INTRAVENOUS

## 2017-11-22 MED ORDER — HYDROMORPHONE HCL 1 MG/ML IJ SOLN
INTRAMUSCULAR | Status: AC
  Filled 2017-11-22: qty 0.5

## 2017-11-22 MED ORDER — PROMETHAZINE HCL 25 MG/ML IJ SOLN: 6.2500 mg | INTRAMUSCULAR | Status: DC | PRN

## 2017-11-22 MED ORDER — HYDROMORPHONE HCL 1 MG/ML IJ SOLN
INTRAMUSCULAR | Status: DC | PRN
  Administered 2017-11-22: 1 mg via INTRAVENOUS

## 2017-11-22 MED ORDER — CEFOTETAN DISODIUM-DEXTROSE 2-2.08 GM-%(50ML) IV SOLR
2.0000 g | INTRAVENOUS | Status: AC
  Administered 2017-11-22: 2 g via INTRAVENOUS

## 2017-11-22 MED ORDER — MORPHINE SULFATE 2 MG/ML IV SOLN
INTRAVENOUS | Status: DC
  Administered 2017-11-22: 12:00:00 via INTRAVENOUS
  Administered 2017-11-22: 1 mg via INTRAVENOUS
  Administered 2017-11-22: 5 mg via INTRAVENOUS
  Administered 2017-11-23: 2.8 mg via INTRAVENOUS
  Filled 2017-11-22: qty 30

## 2017-11-22 MED ORDER — FAMOTIDINE 20 MG PO TABS
20.0000 mg | ORAL_TABLET | Freq: Once | ORAL | Status: AC
  Administered 2017-11-22: 20 mg via ORAL

## 2017-11-22 MED ORDER — SODIUM CHLORIDE 0.9 % IJ SOLN
INTRAMUSCULAR | Status: AC
  Filled 2017-11-22: qty 50

## 2017-11-22 MED ORDER — HYDROMORPHONE HCL 1 MG/ML IJ SOLN
INTRAMUSCULAR | Status: AC
  Filled 2017-11-22: qty 1

## 2017-11-22 MED ORDER — HYDROMORPHONE HCL 1 MG/ML IJ SOLN
0.2500 mg | INTRAMUSCULAR | Status: DC | PRN
  Administered 2017-11-22 (×3): 0.5 mg via INTRAVENOUS

## 2017-11-22 MED ORDER — ACETAMINOPHEN 500 MG PO TABS
ORAL_TABLET | ORAL | Status: AC
  Filled 2017-11-22: qty 2

## 2017-11-22 MED ORDER — BUTORPHANOL TARTRATE 1 MG/ML IJ SOLN: 1.0000 mg | INTRAMUSCULAR | Status: DC | PRN

## 2017-11-22 MED ORDER — FAMOTIDINE 20 MG PO TABS
ORAL_TABLET | ORAL | Status: AC
  Filled 2017-11-22: qty 1

## 2017-11-22 MED ORDER — SODIUM CHLORIDE 0.9% FLUSH: 9.0000 mL | INTRAVENOUS | Status: DC | PRN

## 2017-11-22 MED ORDER — BUPIVACAINE HCL (PF) 0.25 % IJ SOLN
INTRAMUSCULAR | Status: AC
Start: 2017-11-22 — End: 2017-11-22
  Filled 2017-11-22: qty 30

## 2017-11-22 MED ORDER — BUPIVACAINE HCL (PF) 0.25 % IJ SOLN
INTRAMUSCULAR | Status: AC
  Filled 2017-11-22: qty 30

## 2017-11-22 MED ORDER — DEXTROSE IN LACTATED RINGERS 5 % IV SOLN
INTRAVENOUS | Status: DC
  Administered 2017-11-22 – 2017-11-23 (×3): via INTRAVENOUS

## 2017-11-22 MED ORDER — BUPIVACAINE LIPOSOME 1.3 % IJ SUSP
20.0000 mL | Freq: Once | INTRAMUSCULAR | Status: AC
Start: 2017-11-22 — End: 2017-11-22
  Administered 2017-11-22: 20 mL
  Filled 2017-11-22: qty 20

## 2017-11-22 MED ORDER — FENTANYL CITRATE (PF) 100 MCG/2ML IJ SOLN
INTRAMUSCULAR | Status: AC
  Filled 2017-11-22: qty 2

## 2017-11-22 MED ORDER — KETOROLAC TROMETHAMINE 30 MG/ML IJ SOLN: 30.0000 mg | Freq: Four times a day (QID) | INTRAMUSCULAR | Status: DC

## 2017-11-22 MED ORDER — DIPHENHYDRAMINE HCL 50 MG/ML IJ SOLN: 12.5000 mg | Freq: Four times a day (QID) | INTRAMUSCULAR | Status: DC | PRN

## 2017-11-22 MED ORDER — FENTANYL CITRATE (PF) 100 MCG/2ML IJ SOLN
INTRAMUSCULAR | Status: DC | PRN
  Administered 2017-11-22 (×3): 100 ug via INTRAVENOUS

## 2017-11-22 MED ORDER — MIDAZOLAM HCL 2 MG/2ML IJ SOLN
INTRAMUSCULAR | Status: AC
  Filled 2017-11-22: qty 2

## 2017-11-22 MED ORDER — DIPHENHYDRAMINE HCL 12.5 MG/5ML PO ELIX: 12.5000 mg | ORAL_SOLUTION | Freq: Four times a day (QID) | ORAL | Status: DC | PRN

## 2017-11-22 MED ORDER — CELECOXIB 200 MG PO CAPS
ORAL_CAPSULE | ORAL | Status: AC
  Filled 2017-11-22: qty 1

## 2017-11-22 MED ORDER — SODIUM CHLORIDE 0.9 % IJ SOLN
INTRAMUSCULAR | Status: AC
  Filled 2017-11-22: qty 10

## 2017-11-22 MED ORDER — DEXAMETHASONE SODIUM PHOSPHATE 10 MG/ML IJ SOLN
INTRAMUSCULAR | Status: DC | PRN
  Administered 2017-11-22: 10 mg via INTRAVENOUS

## 2017-11-22 SURGICAL SUPPLY — 74 items
ADH SKN CLS APL DERMABOND .7 (GAUZE/BANDAGES/DRESSINGS)
ADH SKN CLS LQ APL DERMABOND (GAUZE/BANDAGES/DRESSINGS) ×2
APL SKNCLS STERI-STRIP NONHPOA (GAUZE/BANDAGES/DRESSINGS)
BARRIER ADHS 3X4 INTERCEED (GAUZE/BANDAGES/DRESSINGS) IMPLANT
BENZOIN TINCTURE PRP APPL 2/3 (GAUZE/BANDAGES/DRESSINGS) IMPLANT
BRR ADH 4X3 ABS CNTRL BYND (GAUZE/BANDAGES/DRESSINGS)
CABLE HIGH FREQUENCY MONO STRZ (ELECTRODE) IMPLANT
CANISTER SUCT 3000ML PPV (MISCELLANEOUS) ×3 IMPLANT
CONT PATH 16OZ SNAP LID 3702 (MISCELLANEOUS) ×4 IMPLANT
COVER BACK TABLE 60X90IN (DRAPES) ×3 IMPLANT
COVER MAYO STAND STRL (DRAPES) ×3 IMPLANT
DECANTER SPIKE VIAL GLASS SM (MISCELLANEOUS) ×1 IMPLANT
DERMABOND ADHESIVE PROPEN (GAUZE/BANDAGES/DRESSINGS) ×1
DERMABOND ADVANCED (GAUZE/BANDAGES/DRESSINGS)
DERMABOND ADVANCED .7 DNX12 (GAUZE/BANDAGES/DRESSINGS) ×2 IMPLANT
DERMABOND ADVANCED .7 DNX6 (GAUZE/BANDAGES/DRESSINGS) IMPLANT
DRAPE CESAREAN BIRTH W POUCH (DRAPES) ×2 IMPLANT
DRAPE WARM FLUID 44X44 (DRAPE) ×2 IMPLANT
DRSG OPSITE POSTOP 3X4 (GAUZE/BANDAGES/DRESSINGS) IMPLANT
DRSG OPSITE POSTOP 4X10 (GAUZE/BANDAGES/DRESSINGS) ×3 IMPLANT
DURAPREP 26ML APPLICATOR (WOUND CARE) ×5 IMPLANT
ELECT REM PT RETURN 9FT ADLT (ELECTROSURGICAL) ×3
ELECTRODE REM PT RTRN 9FT ADLT (ELECTROSURGICAL) ×2 IMPLANT
GAUZE SPONGE 4X4 16PLY XRAY LF (GAUZE/BANDAGES/DRESSINGS) IMPLANT
GLOVE BIO SURGEON STRL SZ7 (GLOVE) ×9 IMPLANT
GLOVE BIOGEL PI IND STRL 6.5 (GLOVE) ×2 IMPLANT
GLOVE BIOGEL PI IND STRL 7.0 (GLOVE) ×8 IMPLANT
GLOVE BIOGEL PI INDICATOR 6.5 (GLOVE) ×1
GLOVE BIOGEL PI INDICATOR 7.0 (GLOVE) ×2
GOWN STRL REUS W/TWL LRG LVL3 (GOWN DISPOSABLE) ×6 IMPLANT
LEGGING LITHOTOMY PAIR STRL (DRAPES) ×3 IMPLANT
LIGASURE IMPACT 36 18CM CVD LR (INSTRUMENTS) ×3 IMPLANT
NDL HYPO 18GX1.5 BLUNT FILL (NEEDLE) IMPLANT
NDL SPNL 22GX3.5 QUINCKE BK (NEEDLE) ×2 IMPLANT
NEEDLE HYPO 18GX1.5 BLUNT FILL (NEEDLE) IMPLANT
NEEDLE INSUFFLATION 120MM (ENDOMECHANICALS) ×3 IMPLANT
NEEDLE SPNL 22GX3.5 QUINCKE BK (NEEDLE) IMPLANT
NS IRRIG 1000ML POUR BTL (IV SOLUTION) ×5 IMPLANT
PACK ABDOMINAL GYN (CUSTOM PROCEDURE TRAY) ×2 IMPLANT
PACK LAVH (CUSTOM PROCEDURE TRAY) ×3 IMPLANT
PACK ROBOTIC GOWN (GOWN DISPOSABLE) ×3 IMPLANT
PACK TRENDGUARD 450 HYBRID PRO (MISCELLANEOUS) IMPLANT
PACK TRENDGUARD 600 HYBRD PROC (MISCELLANEOUS) IMPLANT
PAD OB MATERNITY 4.3X12.25 (PERSONAL CARE ITEMS) ×3 IMPLANT
PROTECTOR NERVE ULNAR (MISCELLANEOUS) ×7 IMPLANT
RTRCTR C-SECT PINK 25CM LRG (MISCELLANEOUS) ×3 IMPLANT
SEALER TISSUE G2 CVD JAW 45CM (ENDOMECHANICALS) ×3 IMPLANT
SET IRRIG TUBING LAPAROSCOPIC (IRRIGATION / IRRIGATOR) IMPLANT
SLEEVE XCEL OPT CAN 5 100 (ENDOMECHANICALS) ×3 IMPLANT
SPONGE LAP 18X18 X RAY DECT (DISPOSABLE) ×2 IMPLANT
STRIP CLOSURE SKIN 1/2X4 (GAUZE/BANDAGES/DRESSINGS) IMPLANT
SUT CHROMIC 3 0 SH 27 (SUTURE) IMPLANT
SUT MON AB 2-0 CT1 36 (SUTURE) ×6 IMPLANT
SUT MON AB 4-0 PS1 27 (SUTURE) ×2 IMPLANT
SUT PDS AB 0 CT1 27 (SUTURE) ×6 IMPLANT
SUT VIC AB 0 CT1 18XCR BRD8 (SUTURE) ×6 IMPLANT
SUT VIC AB 0 CT1 27 (SUTURE)
SUT VIC AB 0 CT1 27XBRD ANBCTR (SUTURE) ×2 IMPLANT
SUT VIC AB 0 CT1 36 (SUTURE) ×4 IMPLANT
SUT VIC AB 0 CT1 8-18 (SUTURE) ×6
SUT VIC AB 2-0 CT1 27 (SUTURE)
SUT VIC AB 2-0 CT1 TAPERPNT 27 (SUTURE) IMPLANT
SUT VIC AB 3-0 CT1 27 (SUTURE)
SUT VIC AB 3-0 CT1 TAPERPNT 27 (SUTURE) ×2 IMPLANT
SUT VIC AB 4-0 PS2 27 (SUTURE) ×3 IMPLANT
SUT VICRYL 0 TIES 12 18 (SUTURE) ×5 IMPLANT
SYR 20CC LL (SYRINGE) ×3 IMPLANT
TOWEL OR 17X24 6PK STRL BLUE (TOWEL DISPOSABLE) ×10 IMPLANT
TRAY FOLEY CATH SILVER 14FR (SET/KITS/TRAYS/PACK) ×5 IMPLANT
TRENDGUARD 450 HYBRID PRO PACK (MISCELLANEOUS) ×3
TRENDGUARD 600 HYBRID PROC PK (MISCELLANEOUS)
TROCAR OPTI TIP 5M 100M (ENDOMECHANICALS) ×3 IMPLANT
TROCAR XCEL DIL TIP R 11M (ENDOMECHANICALS) ×3 IMPLANT
WARMER LAPAROSCOPE (MISCELLANEOUS) ×3 IMPLANT

## 2017-11-22 NOTE — Progress Notes (Signed)
The patient was re-examined with no change in status 

## 2017-11-22 NOTE — Transfer of Care (Signed)
Immediate Anesthesia Transfer of Care Note  Patient: Kayla [name]  Procedure(s) Performed: ATTEMPTED LAPAROSCOPIC ASSISTED VAGINAL HYSTERECTOMY WITH BILATERAL SALPINGECTOMY (Bilateral ) HYSTERECTOMY ABDOMINAL BILATERAL SALPINGECTOMY  Patient Location: PACU  Anesthesia Type:General  Level of Consciousness: awake, alert  and oriented  Airway & Oxygen Therapy: Patient Spontanous Breathing and Patient connected to nasal cannula oxygen  Post-op Assessment: Report given to RN and Post -op Vital signs reviewed and stable  Post vital signs: Reviewed and stable  Last Vitals:  Vitals Value Taken Time  BP 136/85 11/22/2017  9:07 AM  Temp    Pulse 68 11/22/2017  9:09 AM  Resp 15 11/22/2017  9:09 AM  SpO2 98 % 11/22/2017  9:09 AM  Vitals shown include unvalidated device data.  Last Pain:  Vitals:   11/22/17 0621  TempSrc: Oral      Patients Stated Pain Goal: 5 (88/71/95 9747)  Complications: No apparent anesthesia complications

## 2017-11-22 NOTE — Anesthesia Procedure Notes (Signed)
Procedure Name: Intubation Date/Time: 11/22/2017 7:36 AM Performed by: Genevie Ann, CRNA Pre-anesthesia Checklist: Patient identified, Emergency Drugs available, Suction available, Patient being monitored and Timeout performed Patient Re-evaluated:Patient Re-evaluated prior to induction Oxygen Delivery Method: Circle system utilized Preoxygenation: Pre-oxygenation with 100% oxygen Induction Type: IV induction Ventilation: Mask ventilation without difficulty Laryngoscope Size: Mac and 3 Grade View: Grade I Tube size: 7.0 mm Number of attempts: 1 Placement Confirmation: ETT inserted through vocal cords under direct vision,  positive ETCO2 and breath sounds checked- equal and bilateral Secured at: 22 cm Dental Injury: Teeth and Oropharynx as per pre-operative assessment

## 2017-11-22 NOTE — Op Note (Signed)
Preoperative diagnosis: Symptomatic leiomyoma with anemia  Postoperative diagnosis: Same  Procedure: Laparoscopy, followed by laparotomy with TAH bilateral salpingectomy  Surgeon: Matthew Saras  Assistant: [place]  EBL: 200 cc  Procedure and findings:  The patient was taken to the operating room after an adequate level of general anesthesia was obtained with the patient's legs in stirrups the abdomen perineum and vagina were prepped and draped in the normal fashion the bladder was drained with a Foley catheter and a Hulka tenaculum was positioned.  Appropriate timeouts were taken at that point.  Attention directed to the abdomen the subumbilical area was infiltrated with quarter percent Marcaine a small incision was made and the Veress needle was introduced without difficulty.  Intra-abdominal position was verified by pressure water testing.  After 2 L pneumoperitoneum was then created, a lap scopic trocar and sleeve were then introduced without difficulty, there was no evidence of any bleeding or trauma.  3 fingerbreadths above the symphysis in the midline a 5 mm trocar was inserted under direct visualization.  It was difficult to see bilateral adnexa due to the large size of the uterus, this looked to be approximately 12-week size, so the fibroid had grown considerably.  Due to limited mobility and the size of the uterus decision made to proceed with laparotomy.  The legs were placed into the flatter position Pfannenstiel incision made 2 fingerbreadths above the symphysis carried down to the fascia which was incised and extended transversely.  Rectus muscle divided in the midline, peritoneum entered separated without incident and extended in a vertical fashion.  An Alexis retractor was then placed, patient placed in Trendelenburg, bowel was packed superiorly out of the field.  The uterus itself was 12 weeks size mobile, bilateral adnexa unremarkable.  Long Kelly clamps were then placed at each  utero-ovarian pedicle starting on the left round ligament clamped about and suture-ligated peritoneum carried around to the midline the exact same repeated on the opposite side the utero-ovarian pedicle was then isolated doubly clamped first free tied followed by suture ligature 0 Vicryl after cutting the pedicle.  Once this was freed up, the bladder was advanced inferiorly with sharp and blunt dissection the uterine artery was skeletonized on each side.  Ascending branch of the uterine artery was then clamped about and suture-ligated on the left.  The exact same repeated on the right, thus conserving both ovaries.  Once each uterine artery was ligated, the bulk of the uterus was dissected free, after assuring that the bladder was well below the cardinal ligament, uterosacral ligament and cervical vaginal pedicles were clamped about and suture-ligated with 0 Vicryl the specimen was then excised cuff closed with a interrupted 2-0 Vicryl figure-of-eight sutures.  The pelvis was then irrigated with saline noted to be hemostatic.  Bilateral salpingectomy was carried out at each site, the ovarian pedicle was then suspended to the round ligament on each side.  The appendix appeared to be normal no other abnormalities noted, prior to closure sponge, needle, instrument counts reported as correct x2.  Peritoneum closed with 3-0 Vicryl running suture of the same of the rectus muscles in the midline.  0 PDS was then used to close the fascia transversely.  Diluted Exparel solution with quarter percent Marcaine and saline was then injected subfascially and also subcutaneously for postoperative analgesia.  The subcutaneous fat was hemostatic was not closed separately.  4-0 Monocryl subcuticular skin closure with a honeycomb dressing, clear urine noted at the end of the case she tolerated this well  went to recovery room in good condition.  Dictated with Dragon Medical 1  Margarette Asal MD

## 2017-11-22 NOTE — Anesthesia Postprocedure Evaluation (Signed)
Anesthesia Post Note  Patient: [name]  Procedure(s) Performed: ATTEMPTED LAPAROSCOPIC ASSISTED VAGINAL HYSTERECTOMY WITH BILATERAL SALPINGECTOMY (Bilateral ) HYSTERECTOMY ABDOMINAL BILATERAL SALPINGECTOMY     Patient location during evaluation: PACU Anesthesia Type: General Level of consciousness: awake and alert Pain management: pain level controlled Vital Signs Assessment: post-procedure vital signs reviewed and stable Respiratory status: spontaneous breathing, nonlabored ventilation, respiratory function stable and patient connected to nasal cannula oxygen Cardiovascular status: blood pressure returned to baseline and stable Postop Assessment: no apparent nausea or vomiting Anesthetic complications: no    Last Vitals:  Vitals:   11/22/17 1137 11/22/17 1237  BP: 116/66 132/72  Pulse: 72 80  Resp: 18 19  Temp: 36.4 C   SpO2: 97% 98%    Last Pain:  Vitals:   11/22/17 1425  TempSrc:   PainSc: Asleep   Pain Goal: Patients Stated Pain Goal: 4 (11/22/17 1135)               Trula Frede P Izaac Reisig

## 2017-11-22 NOTE — Plan of Care (Signed)
  Problem: Pain Managment: Goal: General experience of comfort will improve Outcome: Progressing   Problem: Safety: Goal: Ability to remain free from injury will improve Outcome: Progressing   Problem: Education: Goal: Knowledge of the prescribed therapeutic regimen will improve Outcome: Progressing   Problem: Education: Goal: Knowledge of General Education information will improve Outcome: Completed/Met

## 2017-11-23 ENCOUNTER — Encounter (HOSPITAL_COMMUNITY): Payer: Self-pay | Admitting: Obstetrics and Gynecology

## 2017-11-23 LAB — CBC
HCT: 25.3 % — ABNORMAL LOW (ref 36.0–46.0)
Hemoglobin: 8.3 g/dL — ABNORMAL LOW (ref 12.0–15.0)
MCH: 31.1 pg (ref 26.0–34.0)
MCHC: 32.8 g/dL (ref 30.0–36.0)
MCV: 94.8 fL (ref 78.0–100.0)
PLATELETS: 263 10*3/uL (ref 150–400)
RBC: 2.67 MIL/uL — ABNORMAL LOW (ref 3.87–5.11)
RDW: 16.2 % — AB (ref 11.5–15.5)
WBC: 8.9 10*3/uL (ref 4.0–10.5)

## 2017-11-23 MED ORDER — OXYCODONE-ACETAMINOPHEN 5-325 MG PO TABS: 1.0000 | ORAL_TABLET | ORAL | 0 refills | Status: DC | PRN

## 2017-11-23 MED ORDER — OXYCODONE-ACETAMINOPHEN 5-325 MG PO TABS
1.0000 | ORAL_TABLET | ORAL | Status: DC | PRN
  Administered 2017-11-23: 2 via ORAL
  Filled 2017-11-23: qty 2

## 2017-11-23 MED ORDER — IBUPROFEN 800 MG PO TABS: 800.0000 mg | ORAL_TABLET | Freq: Three times a day (TID) | ORAL | 0 refills | Status: DC | PRN

## 2017-11-23 NOTE — Progress Notes (Signed)
1 Day Post-Op Procedure(s) (LRB): ATTEMPTED LAPAROSCOPIC ASSISTED VAGINAL HYSTERECTOMY WITH BILATERAL SALPINGECTOMY (Bilateral) HYSTERECTOMY ABDOMINAL BILATERAL SALPINGECTOMY  Subjective: Patient reports tolerating PO.    Objective: BP (!) 104/59 (BP Location: Right Arm)   Pulse (!) 57   Temp 99.4 F (37.4 C) (Oral)   Resp 18   Ht 5\' 2"  (1.575 m)   Wt 173 lb (78.5 kg)   SpO2 99%   BMI 31.64 kg/m  CBC    Component Value Date/Time   WBC 8.9 11/23/2017 0540   RBC 2.67 (L) 11/23/2017 0540   HGB 8.3 (L) 11/23/2017 0540   HCT 25.3 (L) 11/23/2017 0540   PLT 263 11/23/2017 0540   MCV 94.8 11/23/2017 0540   MCH 31.1 11/23/2017 0540   MCHC 32.8 11/23/2017 0540   RDW 16.2 (H) 11/23/2017 0540   LYMPHSABS 2.1 12/20/2007 0921   MONOABS 0.4 12/20/2007 0921   EOSABS 0.0 12/20/2007 0921   BASOSABS 0.0 12/20/2007 0921     abd soft + BS Dressing dry  Assessment: s/p Procedure(s): ATTEMPTED LAPAROSCOPIC ASSISTED VAGINAL HYSTERECTOMY WITH BILATERAL SALPINGECTOMY (Bilateral) HYSTERECTOMY ABDOMINAL BILATERAL SALPINGECTOMY: stable  Plan: Advance diet Encourage ambulation Advance to PO medication Discontinue IV fluids  Poss d/c this pm  LOS: 1 day    Margarette Asal 11/23/2017, 8:23 AM

## 2017-11-23 NOTE — Discharge Summary (Signed)
Physician Discharge Summary  Patient ID: Kayla [name] MRN: [id number] DOB/AGE: 02-22-1971 47 y.o.  Admit date: 11/22/2017 Discharge date: 11/23/2017  Admission Diagnoses:1. ANEMIA  2. LEIOMYOMA  Discharge Diagnoses: SAME Active Problems:   Leiomyoma   Discharged Condition: good  Hospital Course: ADM FOR dl>>lavh, REQUIRED tah + BILAT SALPINGECTOMY. ON pod 1, TOL po, VOIDING W/O DIFFICULTY , afeb and ready for D/C  Consults: None  Significant Diagnostic Studies:  CBC    Component Value Date/Time   WBC 8.9 11/23/2017 0540   RBC 2.67 (L) 11/23/2017 0540   HGB 8.3 (L) 11/23/2017 0540   HCT 25.3 (L) 11/23/2017 0540   PLT 263 11/23/2017 0540   MCV 94.8 11/23/2017 0540   MCH 31.1 11/23/2017 0540   MCHC 32.8 11/23/2017 0540   RDW 16.2 (H) 11/23/2017 0540   LYMPHSABS 2.1 12/20/2007 0921   MONOABS 0.4 12/20/2007 0921   EOSABS 0.0 12/20/2007 0921   BASOSABS 0.0 12/20/2007 0921     Treatments: surgery: DL>>TAH, bilat salpingectomy  Discharge Exam: Blood pressure 107/60, pulse 64, temperature 98.5 F (36.9 C), temperature source Axillary, resp. rate 18, height 5\' 2"  (1.575 m), weight 173 lb (78.5 kg), SpO2 100 %. Incision/Wound:dressing dry, abd soft + BS  Disposition: Discharge disposition: 01-Home or Self Care        Allergies as of 11/23/2017   No Known Allergies     Medication List    STOP taking these medications   TAYTULLA 1-20 MG-MCG(24) Caps Generic drug:  Norethin Ace-Eth Estrad-FE     TAKE these medications   FERREX 150 150 MG capsule Generic drug:  iron polysaccharides Take 150 mg by mouth daily.   ibuprofen 800 MG tablet Commonly known as:  ADVIL,MOTRIN Take 1 tablet (800 mg total) by mouth every 8 (eight) hours as needed (mild pain).   oxyCODONE-acetaminophen 5-325 MG tablet Commonly known as:  PERCOCET/ROXICET Take 1-2 tablets by mouth every 4 (four) hours as needed for moderate pain or severe pain.      Follow-up Information    Molli Posey, MD. Schedule an appointment as soon as possible for a visit in 1 week(s).   Specialty:  Obstetrics and Gynecology Contact information: [address] [place] [address] [phone]           Signed: Margarette Asal 11/23/2017, 1:32 PM

## 2017-11-23 NOTE — Progress Notes (Signed)
Patient discharged home with mother. Discharge teaching, home care, prescriptions, follow-up appts, and reasons to seek are discussed. Pt verbalized understanding, no questions at this time.

## 2017-11-29 DIAGNOSIS — D649 Anemia, unspecified: Secondary | ICD-10-CM | POA: Diagnosis not present

## 2017-12-22 DIAGNOSIS — Z09 Encounter for follow-up examination after completed treatment for conditions other than malignant neoplasm: Secondary | ICD-10-CM | POA: Diagnosis not present

## 2018-03-21 DIAGNOSIS — Z0000 Encounter for general adult medical examination without abnormal findings: Secondary | ICD-10-CM | POA: Diagnosis not present

## 2018-03-21 DIAGNOSIS — D500 Iron deficiency anemia secondary to blood loss (chronic): Secondary | ICD-10-CM | POA: Diagnosis not present

## 2018-04-05 DIAGNOSIS — Z0000 Encounter for general adult medical examination without abnormal findings: Secondary | ICD-10-CM | POA: Diagnosis not present

## 2018-05-10 DIAGNOSIS — Z6831 Body mass index (BMI) 31.0-31.9, adult: Secondary | ICD-10-CM | POA: Diagnosis not present

## 2018-05-10 DIAGNOSIS — Z01419 Encounter for gynecological examination (general) (routine) without abnormal findings: Secondary | ICD-10-CM | POA: Diagnosis not present

## 2018-05-10 DIAGNOSIS — Z780 Asymptomatic menopausal state: Secondary | ICD-10-CM | POA: Diagnosis not present

## 2018-11-01 DIAGNOSIS — L8990 Pressure ulcer of unspecified site, unspecified stage: Secondary | ICD-10-CM | POA: Diagnosis not present

## 2018-11-01 DIAGNOSIS — N760 Acute vaginitis: Secondary | ICD-10-CM | POA: Diagnosis not present
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
## 2019-05-10 ENCOUNTER — Ambulatory Visit: Payer: Self-pay | Admitting: Allergy

## 2020-04-24 ENCOUNTER — Other Ambulatory Visit: Payer: Self-pay | Admitting: Internal Medicine

## 2020-04-24 DIAGNOSIS — R7989 Other specified abnormal findings of blood chemistry: Secondary | ICD-10-CM

## 2020-05-03 ENCOUNTER — Ambulatory Visit
Admission: RE | Admit: 2020-05-03 | Discharge: 2020-05-03 | Disposition: A | Discharge status: Home / Self Care | Payer: 59 | Source: Ambulatory Visit | Attending: Internal Medicine | Admitting: Internal Medicine

## 2020-05-03 DIAGNOSIS — R7989 Other specified abnormal findings of blood chemistry: Secondary | ICD-10-CM

## 2020-07-03 ENCOUNTER — Other Ambulatory Visit: Payer: Self-pay | Admitting: Physician Assistant

## 2020-07-03 DIAGNOSIS — R748 Abnormal levels of other serum enzymes: Secondary | ICD-10-CM

## 2020-07-22 ENCOUNTER — Other Ambulatory Visit: Payer: Self-pay | Admitting: Obstetrics and Gynecology

## 2020-07-22 ENCOUNTER — Other Ambulatory Visit: Payer: Self-pay

## 2020-07-22 ENCOUNTER — Ambulatory Visit
Admission: RE | Admit: 2020-07-22 | Discharge: 2020-07-22 | Disposition: A | Discharge status: Home / Self Care | Payer: 59 | Source: Ambulatory Visit | Attending: Physician Assistant | Admitting: Physician Assistant

## 2020-07-22 DIAGNOSIS — R748 Abnormal levels of other serum enzymes: Secondary | ICD-10-CM

## 2020-07-22 DIAGNOSIS — R928 Other abnormal and inconclusive findings on diagnostic imaging of breast: Secondary | ICD-10-CM

## 2020-07-22 MED ORDER — GADOBENATE DIMEGLUMINE 529 MG/ML IV SOLN
16.0000 mL | Freq: Once | INTRAVENOUS | Status: AC | PRN
  Administered 2020-07-22: 16 mL via INTRAVENOUS

## 2020-07-31 ENCOUNTER — Other Ambulatory Visit (HOSPITAL_COMMUNITY): Payer: Self-pay | Admitting: Physician Assistant

## 2020-07-31 DIAGNOSIS — R945 Abnormal results of liver function studies: Secondary | ICD-10-CM

## 2020-07-31 DIAGNOSIS — R7989 Other specified abnormal findings of blood chemistry: Secondary | ICD-10-CM

## 2020-08-02 ENCOUNTER — Other Ambulatory Visit: Payer: Self-pay | Admitting: Student

## 2020-08-05 ENCOUNTER — Other Ambulatory Visit: Payer: Self-pay

## 2020-08-05 ENCOUNTER — Ambulatory Visit (HOSPITAL_COMMUNITY)
Admission: RE | Admit: 2020-08-05 | Discharge: 2020-08-05 | Disposition: A | Discharge status: Home / Self Care | Payer: 59 | Source: Ambulatory Visit | Attending: Physician Assistant | Admitting: Physician Assistant

## 2020-08-05 ENCOUNTER — Encounter (HOSPITAL_COMMUNITY): Payer: Self-pay

## 2020-08-05 DIAGNOSIS — R748 Abnormal levels of other serum enzymes: Secondary | ICD-10-CM | POA: Diagnosis not present

## 2020-08-05 DIAGNOSIS — R7989 Other specified abnormal findings of blood chemistry: Secondary | ICD-10-CM

## 2020-08-05 DIAGNOSIS — R945 Abnormal results of liver function studies: Secondary | ICD-10-CM

## 2020-08-05 LAB — CBC
HCT: 38.1 % (ref 36.0–46.0)
Hemoglobin: 12.6 g/dL (ref 12.0–15.0)
MCH: 33.4 pg (ref 26.0–34.0)
MCHC: 33.1 g/dL (ref 30.0–36.0)
MCV: 101.1 fL — ABNORMAL HIGH (ref 80.0–100.0)
Platelets: 282 10*3/uL (ref 150–400)
RBC: 3.77 MIL/uL — ABNORMAL LOW (ref 3.87–5.11)
RDW: 12.1 % (ref 11.5–15.5)
WBC: 6.9 10*3/uL (ref 4.0–10.5)
nRBC: 0 % (ref 0.0–0.2)

## 2020-08-05 LAB — APTT: aPTT: 27 seconds (ref 24–36)

## 2020-08-05 LAB — PROTIME-INR
INR: 1.1 (ref 0.8–1.2)
Prothrombin Time: 13.3 seconds (ref 11.4–15.2)

## 2020-08-05 LAB — PREGNANCY, URINE: Preg Test, Ur: NEGATIVE

## 2020-08-05 MED ORDER — LIDOCAINE HCL (PF) 1 % IJ SOLN
INTRAMUSCULAR | Status: AC
  Filled 2020-08-05: qty 30

## 2020-08-05 MED ORDER — GELATIN ABSORBABLE 12-7 MM EX MISC
CUTANEOUS | Status: AC
  Filled 2020-08-05: qty 1

## 2020-08-05 MED ORDER — MIDAZOLAM HCL 2 MG/2ML IJ SOLN
INTRAMUSCULAR | Status: AC
  Filled 2020-08-05: qty 2

## 2020-08-05 MED ORDER — MIDAZOLAM HCL 2 MG/2ML IJ SOLN
INTRAMUSCULAR | Status: AC | PRN
  Administered 2020-08-05 (×2): 1 mg via INTRAVENOUS

## 2020-08-05 MED ORDER — SODIUM CHLORIDE 0.9 % IV SOLN: INTRAVENOUS | Status: DC

## 2020-08-05 MED ORDER — FENTANYL CITRATE (PF) 100 MCG/2ML IJ SOLN
INTRAMUSCULAR | Status: AC | PRN
  Administered 2020-08-05: 25 ug via INTRAVENOUS
  Administered 2020-08-05: 50 ug via INTRAVENOUS

## 2020-08-05 MED ORDER — FENTANYL CITRATE (PF) 100 MCG/2ML IJ SOLN
INTRAMUSCULAR | Status: AC
  Filled 2020-08-05: qty 2

## 2020-08-05 NOTE — H&P (Signed)
Chief Complaint: Patient was seen in consultation today for liver core biopsy at the request of Collier,Amanda R  Referring Physician(s): Quimby R  Supervising Physician: Corrie Mckusick  Patient Status: [facility] - Out-pt  History of Present Illness: Kayla [name] is a 49 y.o. female   Noted liver enzymes increased in August with routine blood work Follow ups since then show continued climb  Now scheduled for liver core biopsy   Past Medical History:  Diagnosis Date  . Anemia   . Headache    otc med prn - trigger "stress"  . HSV infection   . SVD (spontaneous vaginal delivery)    x 2    Past Surgical History:  Procedure Laterality Date  . ABDOMINAL HYSTERECTOMY  11/22/2017   Procedure: HYSTERECTOMY ABDOMINAL;  Surgeon: Molli Posey, MD;  Location: [facility];  Service: Gynecology;;  . BILATERAL SALPINGECTOMY  11/22/2017   Procedure: BILATERAL SALPINGECTOMY;  Surgeon: Molli Posey, MD;  Location: [facility];  Service: Gynecology;;  . LAPAROSCOPIC VAGINAL HYSTERECTOMY WITH SALPINGECTOMY Bilateral 11/22/2017   Procedure: ATTEMPTED LAPAROSCOPIC ASSISTED VAGINAL HYSTERECTOMY WITH BILATERAL SALPINGECTOMY;  Surgeon: Molli Posey, MD;  Location: [facility];  Service: Gynecology;  Laterality: Bilateral;  . NO PAST SURGERIES      Allergies: Patient has no known allergies.  Medications: Prior to Admission medications   Medication Sig Start Date End Date Taking? Authorizing Provider  DULoxetine (CYMBALTA) 30 MG capsule Take 30 mg by mouth daily. 07/01/20   [provider]     History reviewed. No pertinent family history.  Social History   Socioeconomic History  . Marital status: Single    Spouse name: Not on file  . Number of children: Not on file  . Years of education: Not on file  . Highest education level: Not on file  Occupational History  . Not on file  Tobacco Use  . Smoking status: Never Smoker  . Smokeless tobacco: Never Used  Vaping Use  .  Vaping Use: Never used  Substance and Sexual Activity  . Alcohol use: Yes    Comment: Occasional   . Drug use: No  . Sexual activity: Not Currently    Birth control/protection: Pill  Other Topics Concern  . Not on file  Social History Narrative  . Not on file   Social Determinants of Health   Financial Resource Strain: Not on file  Food Insecurity: Not on file  Transportation Needs: Not on file  Physical Activity: Not on file  Stress: Not on file  Social Connections: Not on file    Review of Systems: A 12 point ROS discussed and pertinent positives are indicated in the HPI above.  All other systems are negative.  Review of Systems  Constitutional: Negative for activity change, fatigue and fever.  Respiratory: Negative for cough and shortness of breath.   Cardiovascular: Negative for chest pain.  Gastrointestinal: Negative for abdominal pain and nausea.  Neurological: Negative for weakness.  Psychiatric/Behavioral: Negative for behavioral problems and confusion.    Vital Signs: BP (!) 167/107   Pulse 63   Temp 98 F (36.7 C) (Oral)   Resp 15   Ht 5\' 2"  (1.575 m)   Wt 161 lb (73 kg)   SpO2 100%   BMI 29.45 kg/m   Physical Exam Vitals reviewed.  Cardiovascular:     Rate and Rhythm: Normal rate and regular rhythm.     Heart sounds: Normal heart sounds.  Pulmonary:     Effort: Pulmonary effort is normal.  Breath sounds: Normal breath sounds.  Abdominal:     Palpations: Abdomen is soft.     Tenderness: There is no abdominal tenderness.  Musculoskeletal:        General: Normal range of motion.  Skin:    General: Skin is warm.  Neurological:     Mental Status: She is alert and oriented to person, place, and time.  Psychiatric:        Behavior: Behavior normal.        Judgment: Judgment normal.     Imaging: MR ABDOMEN MRCP W WO CONTAST  Result Date: 07/22/2020 CLINICAL DATA:  Elevated liver enzymes. EXAM: MRI ABDOMEN WITHOUT AND WITH CONTRAST  (INCLUDING MRCP) TECHNIQUE: Multiplanar multisequence MR imaging of the abdomen was performed both before and after the administration of intravenous contrast. Heavily T2-weighted images of the biliary and pancreatic ducts were obtained, and three-dimensional MRCP images were rendered by post processing. CONTRAST:  63mL MULTIHANCE GADOBENATE DIMEGLUMINE 529 MG/ML IV SOLN COMPARISON:  Ultrasound 05/03/2020 FINDINGS: Lower chest:  Lung bases are clear. Hepatobiliary: Normal signal intensity within the liver. No evidence of hepatic steatosis. There is no biliary ductal dilatation. The common bile duct is normal caliber. Gallbladder surgically absent. Subcapsular lesion in the RIGHT hepatic lobe measuring 10 mm is high signal intensity on T2 weighted imaging and demonstrates uniform early enhancement on postcontrast imaging (image 38/series 22). Findings consistent most consistent with small benign hemangioma. Small focus of fatty infiltration along the falciform ligament (image 16/series 4). Liver has normal morphology.  No ascites Portal veins are patent. Pancreas: Normal pancreatic parenchymal intensity. No ductal dilatation or inflammation. Spleen: Normal spleen. Adrenals/urinary tract: Adrenal glands and kidneys are normal. Stomach/Bowel: Stomach and limited of the small bowel is unremarkable Vascular/Lymphatic: Abdominal aortic normal caliber. No retroperitoneal periportal lymphadenopathy. Musculoskeletal: No aggressive osseous lesion IMPRESSION: 1. No explanation for elevated liver enzymes. 2. Normal biliary tree.  Postcholecystectomy. 3. Small flash filling hemangioma within the RIGHT hepatic lobe. Focal fatty infiltration along the falciform ligament. 4. Normal pancreas. Electronically Signed   By: Suzy Bouchard M.D.   On: 07/22/2020 16:40    Labs:  CBC: No results for input(s): WBC, HGB, HCT, PLT in the last 8760 hours.  COAGS: No results for input(s): INR, APTT in the last 8760 hours.  BMP: No  results for input(s): NA, K, CL, CO2, GLUCOSE, BUN, CALCIUM, CREATININE, GFRNONAA, GFRAA in the last 8760 hours.  Invalid input(s): CMP  LIVER FUNCTION TESTS: No results for input(s): BILITOT, AST, ALT, ALKPHOS, PROT, ALBUMIN in the last 8760 hours.  TUMOR MARKERS: No results for input(s): AFPTM, CEA, CA199, CHROMGRNA in the last 8760 hours.  Assessment and Plan:  Elevated liver enzymes Continued climb for 4 months Now scheduled for liver core biopsy Risks and benefits of liver core biopsy was discussed with the patient and/or patient's family including, but not limited to bleeding, infection, damage to adjacent structures or low yield requiring additional tests.  All of the questions were answered and there is agreement to proceed.  Consent signed and in chart.   Thank you for this interesting consult.  I greatly enjoyed meeting Kayla [name] and look forward to participating in their care.  A copy of this report was sent to the requesting provider on this date.  Electronically Signed: Lavonia Drafts, PA-C 08/05/2020, 12:06 PM   I spent a total of  30 Minutes   in face to face in clinical consultation, greater than 50% of which was counseling/coordinating care  for liver core biopsy

## 2020-08-05 NOTE — Discharge Instructions (Signed)
Liver Biopsy, Care After These instructions give you information about how to care for yourself after your procedure. Your health care provider may also give you more specific instructions. If you have problems or questions, contact your health care provider. What can I expect after the procedure? After your procedure, it is common to have:  Pain and soreness in the area where the biopsy was done.  Bruising around the area where the biopsy was done.  Sleepiness and fatigue for 1-2 days. Follow these instructions at home: Medicines  Take over-the-counter and prescription medicines only as told by your health care provider.  If you were prescribed an antibiotic medicine, take it as told by your health care provider. Do not stop taking the antibiotic even if you start to feel better.  Do not take medicines such as aspirin and ibuprofen unless your health care provider tells you to take them. These medicines thin your blood and can increase the risk of bleeding.  If you are taking prescription pain medicine, take actions to prevent or treat constipation. Your health care provider may recommend that you: ? Drink enough fluid to keep your urine pale yellow. ? Eat foods that are high in fiber, such as fresh fruits and vegetables, whole grains, and beans. ? Limit foods that are high in fat and processed sugars, such as fried or sweet foods. ? Take an over-the-counter or prescription medicine for constipation. Incision care  Follow instructions from your health care provider about how to take care of your incision. Make sure you: ? Wash your hands with soap and water before you change your bandage (dressing). If soap and water are not available, use hand sanitizer. ? Change your dressing as told by your health care provider. ? Leave stitches (sutures), skin glue, or adhesive strips in place. These skin closures may need to stay in place for 2 weeks or longer. If adhesive strip edges start to  loosen and curl up, you may trim the loose edges. Do not remove adhesive strips completely unless your health care provider tells you to do that.  Check your incision area every day for signs of infection. Check for: ? Redness, swelling, or pain. ? Fluid or blood. ? Warmth. ? Pus or a bad smell.  Do not take baths, swim, or use a hot tub until your health care provider says it is okay to do so. Activity   Rest at home for 1-2 days, or as directed by your health care provider. ? Avoid sitting for a long time without moving. Get up to take short walks every 1-2 hours. This is important to improve blood flow and breathing. Ask for help if you feel weak or unsteady.  Return to your normal activities as told by your health care provider. Ask your health care provider what activities are safe for you.  Do not drive or use heavy machinery while taking prescription pain medicine.  Do not lift anything that is heavier than 10 lb (4.5 kg), or the limit that your health care provider tells you, until he or she says that it is safe.  Do not play contact sports for 2 weeks after the procedure. General instructions   Do not drink alcohol in the first week after the procedure.  Have someone stay with you for at least 24 hours after the procedure.  It is your responsibility to obtain your test results. Ask your health care provider, or the department that is doing the test: ? When will my   results be ready? ? How will I get my results? ? What are my treatment options? ? What other tests do I need? ? What are my next steps?  Keep all follow-up visits as told by your health care provider. This is important. Contact a health care provider if:  You have increased bleeding from an incision, resulting in more than a small spot of blood.  You have redness, swelling, or increasing pain in any incisions.  You notice a discharge or a bad smell coming from any of your incisions.  You have a fever or  chills. Get help right away if:  You develop swelling, bloating, or pain in your abdomen.  You become dizzy or faint.  You develop a rash.  You have nausea or you vomit.  You faint, or you have shortness of breath or difficulty breathing.  You develop chest pain.  You have problems with your speech or vision.  You have trouble with your balance or moving your arms or legs. Summary  After the liver biopsy, it is common to have pain, soreness, and bruising in the area, as well as sleepiness and fatigue.  Take over-the-counter and prescription medicines only as told by your health care provider.  Follow instructions from your health care provider about how to care for your incision. Check the incision area daily for signs of infection. This information is not intended to replace advice given to you by your health care provider. Make sure you discuss any questions you have with your health care provider. Document Revised: 10/03/2018 Document Reviewed: 08/20/2017 Elsevier Patient Education  2020 Elsevier Inc.  

## 2020-08-05 NOTE — Procedures (Signed)
Interventional Radiology Procedure Note  Procedure: US guided biopsy of liver biopsy, medical liver Complications: None EBL: None Recommendations: - Bedrest 2 hours.   - Routine wound care - Follow up pathology - Advance diet   Signed,  Ronon Ferger, DO     

## 2020-08-06 ENCOUNTER — Ambulatory Visit
Admission: RE | Admit: 2020-08-06 | Discharge: 2020-08-06 | Disposition: A | Discharge status: Home / Self Care | Payer: 59 | Source: Ambulatory Visit | Attending: Obstetrics and Gynecology | Admitting: Obstetrics and Gynecology

## 2020-08-06 ENCOUNTER — Other Ambulatory Visit: Payer: Self-pay | Admitting: Physician Assistant

## 2020-08-06 DIAGNOSIS — R928 Other abnormal and inconclusive findings on diagnostic imaging of breast: Secondary | ICD-10-CM

## 2020-08-07 LAB — SURGICAL PATHOLOGY

## 2020-08-24 HISTORY — PX: LIVER BIOPSY: SHX301

## 2020-09-03 ENCOUNTER — Ambulatory Visit: Payer: 59 | Admitting: Diagnostic Neuroimaging

## 2020-09-03 ENCOUNTER — Encounter: Payer: Self-pay | Admitting: *Deleted

## 2020-09-03 ENCOUNTER — Telehealth: Payer: Self-pay | Admitting: Diagnostic Neuroimaging

## 2020-09-03 VITALS — BP 155/104 | HR 66 | Ht 62.0 in | Wt 155.0 lb

## 2020-09-03 DIAGNOSIS — M6281 Muscle weakness (generalized): Secondary | ICD-10-CM

## 2020-09-03 DIAGNOSIS — E71313 Glutaric aciduria type II: Secondary | ICD-10-CM

## 2020-09-03 NOTE — Progress Notes (Signed)
[facility]  PATIENT: Kayla [name] DOB: April 25, 1971  REFERRING CLINICIAN: Deland Pretty, MD HISTORY FROM: patient  REASON FOR VISIT: new consult    HISTORICAL  CHIEF COMPLAINT:  Chief Complaint  Patient presents with  . Unsteadiness    Rm 6 New Pt "unsteadiness x 2 months, all over weakness; unintentional weight loss"    HISTORY OF PRESENT ILLNESS:   50 year old female here for evaluation of muscle weakness and fatigue.  April 2021 patient received second dose of COVID-vaccine.  Following that she had some problems with feeling tired, fatigue, muscle soreness, shortness of breath.  In July 2021 when she was at the airport in [place] having difficult time getting through the long walks.  In August 2021 patient had additional difficulty with walking, getting dressed, raising her hands above head, washing her hair.  Patient having problems with hip bursitis and pain.  She saw orthopedic and rheumatology consults.  Her legs seem to be more effective than arms.  Patient also was having some stomach cramps GI issues and diagnosed with fibromyalgia.  Of note patient had similar muscle weakness problem when she was 50 year old and second trimester pregnancy.  She developed proximal weakness, diagnosed with myopathy, treated with corticosteroids without benefit.  She had muscle biopsy which showed lipid myopathy.  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 J Clin Neuromuscul Dis. 2003 Mar;4(3):124-8. [email or web address]). Apparently she was treated with carnitine, riboflavin, protein and fat restriction and eventually her symptoms improved.    REVIEW OF SYSTEMS: Full 14 system review of systems  performed and negative with exception of: as per HPI.   ALLERGIES: No Known Allergies  HOME MEDICATIONS: Outpatient Medications Prior to Visit  Medication Sig Dispense Refill  . omeprazole (PRILOSEC) 40 MG capsule Take 40 mg by mouth daily.    . DULoxetine (CYMBALTA) 30 MG capsule Take 30 mg by mouth daily. (Patient not taking: Reported on 09/03/2020)     No facility-administered medications prior to visit.    PAST MEDICAL HISTORY: Past Medical History:  Diagnosis Date  . Anemia   . Bursitis of both hips   . Elevated liver enzymes   . Fibromyalgia   . Headache    otc med prn - trigger "stress"  . HSV infection   . SVD (spontaneous vaginal delivery)    x 2    PAST SURGICAL HISTORY: Past Surgical History:  Procedure Laterality Date  . ABDOMINAL HYSTERECTOMY  11/22/2017   Procedure: HYSTERECTOMY ABDOMINAL;  Surgeon: Molli Posey, MD;  Location: [facility];  Service: Gynecology;;  . BILATERAL SALPINGECTOMY  11/22/2017   Procedure: BILATERAL SALPINGECTOMY;  Surgeon: Molli Posey, MD;  Location: [facility];  Service: Gynecology;;  . LAPAROSCOPIC VAGINAL HYSTERECTOMY WITH SALPINGECTOMY Bilateral 11/22/2017   Procedure: ATTEMPTED LAPAROSCOPIC ASSISTED VAGINAL HYSTERECTOMY WITH BILATERAL SALPINGECTOMY;  Surgeon: Molli Posey, MD;  Location: [facility];  Service: Gynecology;  Laterality: Bilateral;  . LIVER BIOPSY  2022    FAMILY HISTORY: Family History  Problem Relation Age of Onset  . Lupus Mother   . Lupus Brother     SOCIAL HISTORY: Social History   Socioeconomic History  . Marital status: Single    Spouse name: Not on file  . Number of children: 2  . Years of education: Not on file  . Highest education  level: Associate degree: academic program  Occupational History  . Not on file  Tobacco Use  . Smoking status: Never Smoker  . Smokeless tobacco: Never Used  Vaping Use  . Vaping Use: Never used  Substance and Sexual Activity  . Alcohol use: Not Currently  . Drug  use: No  . Sexual activity: Not Currently    Birth control/protection: Pill  Other Topics Concern  . Not on file  Social History Narrative   Lives with children   Caffeine- none   Social Determinants of Health   Financial Resource Strain: Not on file  Food Insecurity: Not on file  Transportation Needs: Not on file  Physical Activity: Not on file  Stress: Not on file  Social Connections: Not on file  Intimate Partner Violence: Not on file     PHYSICAL EXAM  GENERAL EXAM/CONSTITUTIONAL: Vitals:  Vitals:   09/03/20 0825  BP: (!) 155/104  Pulse: 66  Weight: 155 lb (70.3 kg)  Height: _0  (1.575 m)   Body mass index is 28.35 kg/m. Wt Readings from Last 3 Encounters:  09/03/20 155 lb (70.3 kg)  08/05/20 161 lb (73 kg)  11/22/17 173 lb (78.5 kg)    Patient is in no distress; well developed, nourished and groomed; neck is supple  CARDIOVASCULAR:  Examination of carotid arteries is normal; no carotid bruits  Regular rate and rhythm, no murmurs  Examination of peripheral vascular system by observation and palpation is normal  EYES:  Ophthalmoscopic exam of optic discs and posterior segments is normal; no papilledema or hemorrhages No exam data present  MUSCULOSKELETAL:  Gait, strength, tone, movements noted in Neurologic exam below  NEUROLOGIC: MENTAL STATUS:  No flowsheet data found.  awake, alert, oriented to person, place and time  recent and remote memory intact  normal attention and concentration  language fluent, comprehension intact, naming intact  fund of knowledge appropriate  CRANIAL NERVE:   2nd - no papilledema on fundoscopic exam  2nd, 3rd, 4th, 6th - pupils equal and reactive to light, visual fields full to confrontation, extraocular muscles intact, no nystagmus  5th - facial sensation symmetric  7th - facial strength symmetric  8th - hearing intact  9th - palate elevates symmetrically, uvula midline  11th - shoulder shrug  symmetric  12th - tongue protrusion midline  MOTOR:   normal bulk and tone  PROXIMAL MUSCLE WEAKNESS (BUE 2-3, BLE 2-3)  DISTAL MUSCLE WEAKNESS (BUE 4, BLE 4)  SENSORY:   normal and symmetric to light touch, temperature, vibration  COORDINATION:   finger-nose-finger, fine finger movements normal  REFLEXES:   deep tendon reflexes TRACE and symmetric  GAIT/STATION:   SLOW TO STAND; NEEDS TO PUSH UP WITH HANDS; WADDLING GAIT; UNSTEADY     DIAGNOSTIC DATA (LABS, IMAGING, TESTING) - I reviewed patient records, labs, notes, testing and imaging myself where available.  Lab Results  Component Value Date   WBC 6.9 08/05/2020   HGB 12.6 08/05/2020   HCT 38.1 08/05/2020   MCV 101.1 (H) 08/05/2020   PLT 282 08/05/2020      Component Value Date/Time   NA 140 12/20/2007 0921   K 3.6 12/20/2007 0921   CL 106 12/20/2007 0921   CO2 28 12/20/2007 0921   GLUCOSE 86 12/20/2007 0921   BUN 9 12/20/2007 0921   CREATININE 0.85 12/20/2007 0921   CALCIUM 9.0 12/20/2007 0921   PROT 6.7 12/20/2007 0921   ALBUMIN 3.7 12/20/2007 0921   AST 16 12/20/2007 6384  ALT 16 12/20/2007 0921   ALKPHOS 30 (L) 12/20/2007 0921   BILITOT 0.6 12/20/2007 0921   GFRNONAA >60 12/20/2007 0921   GFRAA  12/20/2007 0921    >60        The eGFR has been calculated using the MDRD equation. This calculation has not been validated in all clinical   No results found for: CHOL, HDL, LDLCALC, LDLDIRECT, TRIG, CHOLHDL No results found for: HGBA1C No results found for: VITAMINB12 No results found for: TSH      ASSESSMENT AND PLAN  50 y.o. 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  We will proceed with further work-up to rule out  other causes.  History of possible Adult-Onset Presentation of Glutaric Acidemia Type II With Myopathy; case report written about patient in 2003 --> [email or web address]  "Introduction: Glutaric acidemia type II (GA II) typically presents with profound metabolic acidosis, hypoketotic hypoglycemia, and mild dysmorphic features in newborns. Since its description, 11 patients with adult-onset GA II have been reported.  Case report: A 50 year old woman presented at 50 months gestation with preterm labor and weakness for 3 months. Her weakness worsened to the point of not being able to walk despite treatment with corticosteroids for presumed myositis. 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 Metabolic studies and urine organic acids were suggestive of a multiple acyl-CoA dehydrogenase deficiency. 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 Treatment with carnitine, riboflavin, and diet restrictions improved muscle strength and decreased urine organic acids.  Conclusion: This patient had the unique presentation of GA II with only myopathy. In adults with late-onset weakness, metabolic disorders should be considered in the differential diagnosis."    Dx:  1. Muscle weakness   2. 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 MR BRAIN W WO CONTRAST  . CK  . Aldolase  . AChR Abs with Reflex to MuSK  . Vitamin B12  . TSH  . CBC with Differential/Platelet  . Comprehensive  metabolic panel  . Hemoglobin A1c  . Organic Acid Analysis, Urine  . Acylcarnitine Profile, Plasma  . Ammonia  . NCV with EMG(electromyography)   Return for for NCV/EMG.    VIKPenni BombardD 09/03/42/8185:26:31 Certified in Neurology, Neurophysiology and Neuroimaging  [facility] [address] [id number]

## 2020-09-03 NOTE — Telephone Encounter (Signed)
I called patient to review nutrition and supplement treatment plan. No answer. Will ask nurse to reach out to patient later this week. Per my consult note, I recommend: - recommend low protein, low fat diet; increase whole grains and vegetables - avoid long fasting; eat 3-4 times per day - may consider nutrition consult if needed  - start OTC carnitine 1000mg  twice a day - start OTC riboflavin 100mg  daily - start OTC co-Q10 100mg  daily   Penni Bombard, MD 0/27/2536, 6:44 PM Certified in Neurology, Neurophysiology and Neuroimaging  [facility] [address] [place], [place] [address] [phone]

## 2020-09-03 NOTE — Patient Instructions (Addendum)
MUSCLE WEAKNESS (proximal > distal) - myopathy, neuropathy labs - EMG/NCS - MRI brain (rule out MS) - will follow up history of "Adult-Onset Presentation of Glutaric Acidemia Type II With Myopathy" - recommend low protein, low fat diet; will consider supplements

## 2020-09-04 NOTE — Telephone Encounter (Signed)
Patient returned call. I reviewed Dr Gladstone Lighter plan with her; she wrote it down, repeated correctly. She asked about Labs, MRI;  I advised her of normal labs but most are pending. MRI waiting for insurance approval. Then she'll get call to schedule. Patient verbalized understanding, appreciation.

## 2020-09-04 NOTE — Telephone Encounter (Signed)
LVM requesting call back to give her Dr Gladstone Lighter nutritional plan and supplement list.

## 2020-09-05 ENCOUNTER — Encounter: Payer: 59 | Admitting: Diagnostic Neuroimaging

## 2020-09-05 ENCOUNTER — Telehealth: Payer: Self-pay | Admitting: Diagnostic Neuroimaging

## 2020-09-05 ENCOUNTER — Other Ambulatory Visit: Payer: Self-pay

## 2020-09-05 ENCOUNTER — Ambulatory Visit (INDEPENDENT_AMBULATORY_CARE_PROVIDER_SITE_OTHER): Payer: 59 | Admitting: Diagnostic Neuroimaging

## 2020-09-05 DIAGNOSIS — M6281 Muscle weakness (generalized): Secondary | ICD-10-CM | POA: Diagnosis not present

## 2020-09-05 DIAGNOSIS — Z0289 Encounter for other administrative examinations: Secondary | ICD-10-CM

## 2020-09-05 NOTE — Procedures (Signed)
[facility]  NCS (NERVE CONDUCTION STUDY) WITH EMG (ELECTROMYOGRAPHY) REPORT   STUDY DATE: 09/05/20 PATIENT NAME: Kayla [name] DOB: 07-17-1971 MRN: [id number]  ORDERING CLINICIAN: Andrey Spearman, MD   TECHNOLOGIST: Sherre Scarlet ELECTROMYOGRAPHER: Earlean Polka. , MD  CLINICAL INFORMATION: 50 year old female with generalized weakness.  Evaluate for myopathy.  FINDINGS: NERVE CONDUCTION STUDY:  Right median, right ulnar, right peroneal and right tibial motor responses are normal.  Right median, right ulnar, right sural and right superficial peroneal sensory responses are normal.  Right ulnar and right tibial F wave latencies are normal.    NEEDLE ELECTROMYOGRAPHY:  Needle examination of right upper extremity shows early recruitment of polyphasic motor units in all right upper extremity muscles except for right first dorsal interosseous which is normal.    IMPRESSION:   Abnormal study demonstrating: - Electrodiagnostic evidence of myopathy on needle EMG.    INTERPRETING PHYSICIAN:  Penni Bombard, MD Certified in Neurology, Neurophysiology and Neuroimaging  [facility] [address], [place], [place] [address] [phone]   [facility]    Nerve / Sites Muscle Latency Ref. Amplitude Ref. Rel Amp Segments Distance Velocity Ref. Area    ms ms mV mV %  cm m/s m/s mVms  R Median - APB     Wrist APB 4.0 ?4.4 7.0 ?4.0 100 Wrist - APB 7   20.3     Upper arm APB 7.6  6.6  94.5 Upper arm - Wrist 21 57 ?49 19.3  R Ulnar - ADM     Wrist ADM 2.5 ?3.3 8.9 ?6.0 100 Wrist - ADM 7   23.7     B.Elbow ADM 5.6  8.2  91.9 B.Elbow - Wrist 20 64 ?49 21.5     A.Elbow ADM 7.2  8.1  98 A.Elbow - B.Elbow 10 64 ?49 21.6         A.Elbow - Wrist      R Peroneal - EDB     Ankle EDB 4.0 ?6.5 8.6 ?2.0 100 Ankle - EDB 9   24.8     Fib head EDB 9.5  7.9  92.2 Fib head - Ankle 29 53 ?44 23.5     Pop fossa EDB 11.4  7.9  99.3 Pop fossa - Fib  head 10 52 ?44 23.4         Pop fossa - Ankle      R Tibial - AH     Ankle AH 4.4 ?5.8 10.5 ?4.0 100 Ankle - AH 9   19.9     Pop fossa AH 12.5  8.9  85.1 Pop fossa - Ankle 39 48 ?41 19.0             SNC    Nerve / Sites Rec. Site Peak Lat Ref.  Amp Ref. Segments Distance    ms ms V V  cm  R Sural - Ankle (Calf)     Calf Ankle 3.6 ?4.4 28 ?6 Calf - Ankle 14  R Superficial peroneal - Ankle     Lat leg Ankle 3.5 ?4.4 7 ?6 Lat leg - Ankle 14  R Median - Orthodromic (Dig II, Mid palm)     Dig II Wrist 3.4 ?3.4 10 ?10 Dig II - Wrist 13  R Ulnar - Orthodromic, (Dig V, Mid palm)     Dig V Wrist 2.5 ?3.1 6 ?5 Dig V - Wrist 11  F  Wave    Nerve F Lat Ref.   ms ms  R Tibial - AH 48.1 ?56.0  R Ulnar - ADM 25.5 ?32.0         EMG Summary Table    Spontaneous MUAP Recruitment  Muscle IA Fib PSW Fasc Other Amp Dur. Poly Pattern  R. Deltoid Normal None None None _______ Decreased Normal 2+ Early  R. Biceps brachii Normal None None None _______ Decreased Normal 2+ Early  R. Triceps brachii Normal None 1+ None CRDs Decreased Increased 2+ Early  R. Flexor carpi radialis Normal None None None _______ Decreased Normal 2+ Early  R. First dorsal interosseous Normal None None None _______ Normal Normal Normal Normal

## 2020-09-05 NOTE — Telephone Encounter (Signed)
no to the covid questions MR Brain w/wo contrast Dr. [facility] Kaufmann: [id number] (exp. 09/05/20 to 10/20/20). Patient is scheduled at [facility] for 09/11/20.

## 2020-09-06 LAB — ORGANIC ACID ANALYSIS, URINE

## 2020-09-11 ENCOUNTER — Other Ambulatory Visit: Payer: 59

## 2020-09-12 LAB — CK: Total CK: 1040 U/L (ref 32–182)

## 2020-09-12 LAB — CBC WITH DIFFERENTIAL/PLATELET
Basophils Absolute: 0 10*3/uL (ref 0.0–0.2)
Basos: 0 %
EOS (ABSOLUTE): 0 10*3/uL (ref 0.0–0.4)
Eos: 0 %
Hematocrit: 39.6 % (ref 34.0–46.6)
Hemoglobin: 13.4 g/dL (ref 11.1–15.9)
Immature Grans (Abs): 0 10*3/uL (ref 0.0–0.1)
Immature Granulocytes: 0 %
Lymphocytes Absolute: 1.3 10*3/uL (ref 0.7–3.1)
Lymphs: 18 %
MCH: 31.8 pg (ref 26.6–33.0)
MCHC: 33.8 g/dL (ref 31.5–35.7)
MCV: 94 fL (ref 79–97)
Monocytes Absolute: 0.5 10*3/uL (ref 0.1–0.9)
Monocytes: 7 %
Neutrophils Absolute: 5.1 10*3/uL (ref 1.4–7.0)
Neutrophils: 75 %
Platelets: 361 10*3/uL (ref 150–450)
RBC: 4.21 x10E6/uL (ref 3.77–5.28)
RDW: 11.3 % — ABNORMAL LOW (ref 11.7–15.4)
WBC: 6.9 10*3/uL (ref 3.4–10.8)

## 2020-09-12 LAB — COMPREHENSIVE METABOLIC PANEL WITH GFR
ALT: 103 [IU]/L — ABNORMAL HIGH (ref 0–32)
AST: 98 [IU]/L — ABNORMAL HIGH (ref 0–40)
Albumin/Globulin Ratio: 1.9 (ref 1.2–2.2)
Albumin: 4.3 g/dL (ref 3.8–4.8)
Alkaline Phosphatase: 32 [IU]/L — ABNORMAL LOW (ref 44–121)
BUN/Creatinine Ratio: 30 — ABNORMAL HIGH (ref 9–23)
BUN: 15 mg/dL (ref 6–24)
Bilirubin Total: 0.4 mg/dL (ref 0.0–1.2)
CO2: 24 mmol/L (ref 20–29)
Calcium: 9.6 mg/dL (ref 8.7–10.2)
Chloride: 101 mmol/L (ref 96–106)
Creatinine, Ser: 0.5 mg/dL — ABNORMAL LOW (ref 0.57–1.00)
GFR calc Af Amer: 133 mL/min/{1.73_m2}
GFR calc non Af Amer: 116 mL/min/{1.73_m2}
Globulin, Total: 2.3 g/dL (ref 1.5–4.5)
Glucose: 106 mg/dL — ABNORMAL HIGH (ref 65–99)
Potassium: 4.6 mmol/L (ref 3.5–5.2)
Sodium: 139 mmol/L (ref 134–144)
Total Protein: 6.6 g/dL (ref 6.0–8.5)

## 2020-09-12 LAB — MUSK ANTIBODIES: MuSK Antibodies: <1 U/mL

## 2020-09-12 LAB — ACYLCARNITINE PROFILE, PLASMA
C10: 1.33 umol/L — ABNORMAL HIGH (ref 0.00–0.38)
C10:1: 0.2 umol/L (ref 0.01–0.32)
C10:2: 0.02 umol/L (ref 0.00–0.05)
C12: 1.25 umol/L — ABNORMAL HIGH (ref 0.00–0.15)
C14-Hydroxy: 0.01 umol/L (ref 0.00–0.02)
C14: 1.22 umol/L — ABNORMAL HIGH (ref 0.00–0.06)
C14:1: 1.04 umol/L — ABNORMAL HIGH (ref 0.00–0.17)
C14:2: 0.44 umol/L — ABNORMAL HIGH (ref 0.00–0.11)
C16-Hydroxy: 0.02 umol/L (ref 0.00–0.02)
C16: 1.68 umol/L — ABNORMAL HIGH (ref 0.03–0.13)
C16:1-Hydroxy: 0.08 umol/L — ABNORMAL HIGH (ref 0.00–0.02)
C16:1: 1.27 umol/L — ABNORMAL HIGH (ref 0.00–0.04)
C18-Hydroxy: 0.01 umol/L (ref 0.00–0.02)
C18: 1 umol/L — ABNORMAL HIGH (ref 0.00–0.07)
C18:1-Hydroxy: 0.02 umol/L (ref 0.00–0.02)
C18:1: 1.58 umol/L — ABNORMAL HIGH (ref 0.04–0.17)
C18:2-Hydroxy: 0.01 umol/L (ref 0.00–0.01)
C18:2: 0.87 umol/L — ABNORMAL HIGH (ref 0.00–0.11)
C2: 4.31 umol/L (ref 3.23–10.29)
C3-Dicarboxylic: 0.02 umol/L (ref 0.02–0.12)
C3: 0.22 umol/L (ref 0.16–0.62)
C4-Dicarboxylic: 0.05 umol/L (ref 0.01–0.07)
C4-Hydroxy: 0.07 umol/L (ref 0.00–0.09)
C4: 0.62 umol/L — ABNORMAL HIGH (ref 0.08–0.32)
C5-Dicarboxylic: 0.18 umol/L — ABNORMAL HIGH (ref 0.00–0.10)
C5-Hydroxy: 0.02 umol/L (ref 0.00–0.06)
C5: 1.07 umol/L — ABNORMAL HIGH (ref 0.01–0.21)
C5:1: 0.01 umol/L (ref 0.00–0.02)
C6: 0.29 umol/L — ABNORMAL HIGH (ref 0.00–0.10)
C8: 0.78 umol/L — ABNORMAL HIGH (ref 0.00–0.27)

## 2020-09-12 LAB — ACHR ABS WITH REFLEX TO MUSK: AChR Binding Ab, Serum: <0.03 nmol/L (ref 0.00–0.24)

## 2020-09-12 LAB — VITAMIN B12: Vitamin B-12: 325 pg/mL (ref 232–1245)

## 2020-09-12 LAB — TSH: TSH: 1.73 u[IU]/mL (ref 0.450–4.500)

## 2020-09-12 LAB — HEMOGLOBIN A1C
Est. average glucose Bld gHb Est-mCnc: 97 mg/dL
Hgb A1c MFr Bld: 5 % (ref 4.8–5.6)

## 2020-09-12 LAB — ALDOLASE: Aldolase: 11 U/L — ABNORMAL HIGH (ref 3.3–10.3)

## 2020-09-12 LAB — AMMONIA: Ammonia: 79 ug/dL (ref 31–155)

## 2020-11-05 ENCOUNTER — Encounter: Payer: Self-pay | Admitting: Diagnostic Neuroimaging

## 2020-11-05 ENCOUNTER — Ambulatory Visit: Payer: 59 | Admitting: Diagnostic Neuroimaging

## 2020-11-05 VITALS — BP 129/86 | HR 62 | Ht 61.0 in | Wt 166.3 lb

## 2020-11-05 DIAGNOSIS — E71313 Glutaric aciduria type II: Secondary | ICD-10-CM | POA: Diagnosis not present

## 2020-11-05 DIAGNOSIS — M6281 Muscle weakness (generalized): Secondary | ICD-10-CM | POA: Diagnosis not present

## 2020-11-05 MED ORDER — CARNITINE (L) POWD: 1000.0000 mg | Freq: Two times a day (BID) | Status: AC

## 2020-11-05 MED ORDER — RIBOFLAVIN 100 MG PO TABS: 100.0000 mg | ORAL_TABLET | Freq: Every day | ORAL | 0 refills | Status: AC

## 2020-11-05 MED ORDER — COQ10 100 MG PO CAPS: 100.0000 mg | ORAL_CAPSULE | Freq: Every day | ORAL | Status: AC

## 2020-11-05 NOTE — Progress Notes (Signed)
[facility]  PATIENT: Kayla [name] DOB: 22-Jul-1971  REFERRING CLINICIAN: Deland Pretty, MD HISTORY FROM: patient  REASON FOR VISIT: follow up   HISTORICAL  CHIEF COMPLAINT:  Chief Complaint  Patient presents with  . Follow-up    Rm 6 alone. Here for 3 month f/u over all reports she is doing well. She reports she is doing much better than her last visit.     HISTORY OF PRESENT ILLNESS:   UPDATE (11/05/20, VRP): Since last visit, doing well. Now on supplements and low prot / low fat diet. Muscle weakness much improved.    PRIOR HPI (09/03/20): 50 year old female here for evaluation of muscle weakness and fatigue.  April 2021 patient received second dose of COVID-vaccine.  Following that she had some problems with feeling tired, fatigue, muscle soreness, shortness of breath.  In July 2021 when she was at the airport in [place] having difficult time getting through the long walks.  In August 2021 patient had additional difficulty with walking, getting dressed, raising her hands above head, washing her hair.  Patient having problems with hip bursitis and pain.  She saw orthopedic and rheumatology consults.  Her legs seem to be more effective than arms.  Patient also was having some stomach cramps GI issues and diagnosed with fibromyalgia.  Of note patient had similar muscle weakness problem when she was 50 year old and second trimester pregnancy.  She developed proximal weakness, diagnosed with myopathy, treated with corticosteroids without benefit.  She had muscle biopsy which showed lipid myopathy.  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 J Clin Neuromuscul Dis. 2003 Mar;4(3):124-8.  [email or web address]). Apparently she was treated with carnitine, riboflavin, protein and fat restriction and eventually her symptoms improved.    REVIEW OF SYSTEMS: Full 14 system review of systems performed and negative with exception of: as per HPI.   ALLERGIES: No Known Allergies  HOME MEDICATIONS: Outpatient Medications Prior to Visit  Medication Sig Dispense Refill  . DULoxetine (CYMBALTA) 30 MG capsule Take 30 mg by mouth daily. (Patient not taking: Reported on 11/05/2020)    . omeprazole (PRILOSEC) 40 MG capsule Take 40 mg by mouth daily. (Patient not taking: Reported on 11/05/2020)     No facility-administered medications prior to visit.    PAST MEDICAL HISTORY: Past Medical History:  Diagnosis Date  . Anemia   . Bursitis of both hips   . Elevated liver enzymes   . Fibromyalgia   . Headache    otc med prn - trigger "stress"  . HSV infection   . SVD (spontaneous vaginal delivery)    x 2    PAST SURGICAL HISTORY: Past Surgical History:  Procedure Laterality Date  . ABDOMINAL HYSTERECTOMY  11/22/2017   Procedure: HYSTERECTOMY ABDOMINAL;  Surgeon: Molli Posey, MD;  Location: [facility];  Service: Gynecology;;  . BILATERAL SALPINGECTOMY  11/22/2017   Procedure: BILATERAL SALPINGECTOMY;  Surgeon: Molli Posey, MD;  Location: [facility];  Service: Gynecology;;  . LAPAROSCOPIC VAGINAL HYSTERECTOMY WITH SALPINGECTOMY Bilateral 11/22/2017   Procedure: ATTEMPTED LAPAROSCOPIC ASSISTED VAGINAL HYSTERECTOMY WITH BILATERAL SALPINGECTOMY;  Surgeon: Molli Posey, MD;  Location: [facility];  Service: Gynecology;  Laterality: Bilateral;  . LIVER BIOPSY  2022    FAMILY HISTORY: Family History  Problem Relation Age of Onset  . Lupus Mother   .  Lupus Brother     SOCIAL HISTORY: Social History   Socioeconomic History  . Marital status: Single    Spouse name: Not on file  . Number of children: 2  . Years of education: Not on file  . Highest education level:  Associate degree: academic program  Occupational History  . Not on file  Tobacco Use  . Smoking status: Never Smoker  . Smokeless tobacco: Never Used  Vaping Use  . Vaping Use: Never used  Substance and Sexual Activity  . Alcohol use: Not Currently  . Drug use: No  . Sexual activity: Not Currently    Birth control/protection: Pill  Other Topics Concern  . Not on file  Social History Narrative   Lives with children   Caffeine- none   Social Determinants of Health   Financial Resource Strain: Not on file  Food Insecurity: Not on file  Transportation Needs: Not on file  Physical Activity: Not on file  Stress: Not on file  Social Connections: Not on file  Intimate Partner Violence: Not on file     PHYSICAL EXAM  GENERAL EXAM/CONSTITUTIONAL: Vitals:  Vitals:   11/05/20 0906  BP: 129/86  Pulse: 62  Weight: 166 lb 5 oz (75.4 kg)  Height: 5\' 1"  (1.549 m)   Body mass index is 31.42 kg/m. Wt Readings from Last 3 Encounters:  11/05/20 166 lb 5 oz (75.4 kg)  09/03/20 155 lb (70.3 kg)  08/05/20 161 lb (73 kg)    Patient is in no distress; well developed, nourished and groomed; neck is supple  CARDIOVASCULAR:  Examination of carotid arteries is normal; no carotid bruits  Regular rate and rhythm, no murmurs  Examination of peripheral vascular system by observation and palpation is normal  EYES:  Ophthalmoscopic exam of optic discs and posterior segments is normal; no papilledema or hemorrhages No exam data present  MUSCULOSKELETAL:  Gait, strength, tone, movements noted in Neurologic exam below  NEUROLOGIC: MENTAL STATUS:  No flowsheet data found.  awake, alert, oriented to person, place and time  recent and remote memory intact  normal attention and concentration  language fluent, comprehension intact, naming intact  fund of knowledge appropriate  CRANIAL NERVE:   2nd - no papilledema on fundoscopic exam  2nd, 3rd, 4th, 6th - pupils equal and  reactive to light, visual fields full to confrontation, extraocular muscles intact, no nystagmus  5th - facial sensation symmetric  7th - facial strength symmetric  8th - hearing intact  9th - palate elevates symmetrically, uvula midline  11th - shoulder shrug symmetric  12th - tongue protrusion midline  MOTOR:   normal bulk and tone  SUBTLE PROXIMAL MUSCLE WEAKNESS (BUE 4+, BLE 4)   SENSORY:   normal and symmetric to light touch, temperature, vibration  COORDINATION:   finger-nose-finger, fine finger movements normal  REFLEXES:   deep tendon reflexes TRACE and symmetric  GAIT/STATION:   SLOW TO STAND; STEADY GAIT; MINIMAL HIP TILT     DIAGNOSTIC DATA (LABS, IMAGING, TESTING) - I reviewed patient records, labs, notes, testing and imaging myself where available.  Lab Results  Component Value Date   WBC 6.9 09/03/2020   HGB 13.4 09/03/2020   HCT 39.6 09/03/2020   MCV 94 09/03/2020   PLT 361 09/03/2020      Component Value Date/Time   NA 139 09/03/2020 1354   K 4.6 09/03/2020 1354   CL 101 09/03/2020 1354   CO2 24 09/03/2020 1354   GLUCOSE 106 (H) 09/03/2020 1354  GLUCOSE 86 12/20/2007 0921   BUN 15 09/03/2020 1354   CREATININE 0.50 (L) 09/03/2020 1354   CALCIUM 9.6 09/03/2020 1354   PROT 6.6 09/03/2020 1354   ALBUMIN 4.3 09/03/2020 1354   AST 98 (H) 09/03/2020 1354   ALT 103 (H) 09/03/2020 1354   ALKPHOS 32 (L) 09/03/2020 1354   BILITOT 0.4 09/03/2020 1354   GFRNONAA 116 09/03/2020 1354   GFRAA 133 09/03/2020 1354   No results found for: CHOL, HDL, LDLCALC, LDLDIRECT, TRIG, CHOLHDL Lab Results  Component Value Date   HGBA1C 5.0 09/03/2020   Lab Results  Component Value Date   VITAMINB12 325 09/03/2020   Lab Results  Component Value Date   TSH 1.730 09/03/2020    09/05/20 EMG/NCS - Electrodiagnostic evidence of myopathy on needle EMG.  09/03/20 acylcarnitine profile - Comment: Plasma acylcarnitine analysis revealed elevations of  short-chain,  medium-chain, and long-chain acylcarnitine species. This pattern can be  associated with multiple acyl-CoA dehydrogenase deficiency (glutaric  acidemia type II). If clinically indicated, consider repeating this study  and obtaining urine acylglycine analysis and molecular genetic test to  confirm/exclude this disorder.    ASSESSMENT AND PLAN  50 y.o. 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    History of possible Adult-Onset Presentation of Glutaric Acidemia Type II With Myopathy; case report written about patient in 2003 --> [email or web address]  "Introduction: Glutaric acidemia type II (GA II) typically presents with profound metabolic acidosis, hypoketotic hypoglycemia, and mild dysmorphic features in newborns. Since its description, 11 patients with adult-onset GA II have been reported.  Case report: A 50 year old woman presented at 76 months gestation with preterm labor and weakness for 3 months. Her weakness worsened to the point of not being able to walk despite treatment with corticosteroids for presumed myositis. 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 Metabolic studies and urine organic acids were suggestive of a multiple acyl-CoA dehydrogenase deficiency. 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 Treatment with carnitine, riboflavin, and diet restrictions improved muscle strength and decreased urine organic acids.  Conclusion: This patient had the unique presentation of GA II with only myopathy. In adults with late-onset weakness, metabolic disorders should be considered in the  differential diagnosis."    Dx:  1. Glutaric acidemia, type 2 ([place])   2. 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    Penni Bombard, MD 3/66/2947, 6:54 AM Certified in Neurology, Neurophysiology and Neuroimaging  [facility] [address] [place], [place] [address] [phone]

## 2020-11-05 NOTE — Patient Instructions (Signed)
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

## 2021-04-27 ENCOUNTER — Emergency Department (HOSPITAL_COMMUNITY)
Admission: EM | Admit: 2021-04-27 | Discharge: 2021-04-27 | Disposition: A | Discharge status: Home / Self Care | Payer: 59 | Attending: Emergency Medicine | Admitting: Emergency Medicine

## 2021-04-27 ENCOUNTER — Encounter (HOSPITAL_COMMUNITY): Payer: Self-pay | Admitting: Emergency Medicine

## 2021-04-27 DIAGNOSIS — K0889 Other specified disorders of teeth and supporting structures: Secondary | ICD-10-CM | POA: Diagnosis present

## 2021-04-27 DIAGNOSIS — K047 Periapical abscess without sinus: Secondary | ICD-10-CM | POA: Insufficient documentation

## 2021-04-27 MED ORDER — AMOXICILLIN-POT CLAVULANATE 875-125 MG PO TABS: 1.0000 | ORAL_TABLET | Freq: Two times a day (BID) | ORAL | 0 refills | Status: DC

## 2021-04-27 NOTE — Discharge Instructions (Addendum)
Rinse with Listerine after every meal. '600mg'$  Advil Liquid gels with '650mg'$  Tylenol ever 6 hours as needed for pain. Take antibiotics as prescribed.

## 2021-04-27 NOTE — ED Notes (Signed)
Discharged by PA at triage.

## 2021-04-27 NOTE — ED Triage Notes (Signed)
Pt reports L lower dental pain with facial swelling since yesterday.

## 2021-04-27 NOTE — ED Provider Notes (Signed)
[facility] EMERGENCY DEPARTMENT Provider Note   CSN: [id number] Arrival date & time: 04/27/21  1511     History No chief complaint on file.   Kayla [name] is a 50 y.o. female.  50 year old female with complaint of left lower dental abscess.  Known problem with left lower molar now with worsening pain and swelling to left mandible.  Denies fever, trauma, drainage.  No other complaints or concerns.      Past Medical History:  Diagnosis Date   Anemia    Bursitis of both hips    Elevated liver enzymes    Fibromyalgia    Headache    otc med prn - trigger "stress"   HSV infection    SVD (spontaneous vaginal delivery)    x 2    Patient Active Problem List   Diagnosis Date Noted   Leiomyoma 11/22/2017    Past Surgical History:  Procedure Laterality Date   ABDOMINAL HYSTERECTOMY  11/22/2017   Procedure: HYSTERECTOMY ABDOMINAL;  Surgeon: Molli Posey, MD;  Location: [facility];  Service: Gynecology;;   BILATERAL SALPINGECTOMY  11/22/2017   Procedure: BILATERAL SALPINGECTOMY;  Surgeon: Molli Posey, MD;  Location: [facility];  Service: Gynecology;;   LAPAROSCOPIC VAGINAL HYSTERECTOMY WITH SALPINGECTOMY Bilateral 11/22/2017   Procedure: ATTEMPTED LAPAROSCOPIC ASSISTED VAGINAL HYSTERECTOMY WITH BILATERAL SALPINGECTOMY;  Surgeon: Molli Posey, MD;  Location: [facility];  Service: Gynecology;  Laterality: Bilateral;   LIVER BIOPSY  2022     OB History     Gravida  2   Para  2   Term  2   Preterm      AB      Living         SAB      IAB      Ectopic      Multiple      Live Births              Family History  Problem Relation Age of Onset   Lupus Mother    Lupus Brother     Social History   Tobacco Use   Smoking status: Never   Smokeless tobacco: Never  Vaping Use   Vaping Use: Never used  Substance Use Topics   Alcohol use: Not Currently   Drug use: No    Home Medications Prior to Admission medications   Medication Sig  Start Date End Date Taking? Authorizing Provider  amoxicillin-clavulanate (AUGMENTIN) 875-125 MG tablet Take 1 tablet by mouth every 12 (twelve) hours. 04/27/21  Yes Tacy Learn, PA-C  Coenzyme Q10 (COQ10) 100 MG CAPS Take 100 mg by mouth daily. 11/05/20   Penumalli, Earlean Polka, MD  DULoxetine (CYMBALTA) 30 MG capsule Take 30 mg by mouth daily. Patient not taking: Reported on 11/05/2020 07/01/20   [provider]  levOCARNitine (CARNITINE, L,) POWD Take 1,000 mg by mouth in the morning and at bedtime. 11/05/20   Penumalli, Earlean Polka, MD  omeprazole (PRILOSEC) 40 MG capsule Take 40 mg by mouth daily. Patient not taking: Reported on 11/05/2020 08/13/20   [provider]  Riboflavin 100 MG TABS Take 1 tablet (100 mg total) by mouth daily. 11/05/20   Penumalli, Earlean Polka, MD    Allergies    Patient has no known allergies.  Review of Systems   Review of Systems  Constitutional:  Negative for chills and fever.  HENT:  Positive for dental problem. Negative for trouble swallowing and voice change.   Gastrointestinal:  Negative for vomiting.  Musculoskeletal:  Negative for neck pain and neck stiffness.  Skin:  Negative for rash and wound.  Allergic/Immunologic: Negative for immunocompromised state.  Neurological:  Negative for headaches.  Hematological:  Negative for adenopathy.  Psychiatric/Behavioral:  Negative for confusion.   All other systems reviewed and are negative.  Physical Exam Updated Vital Signs BP (!) 142/102 (BP Location: Right Arm)   Pulse (!) 58   Temp 98.8 F (37.1 C) (Oral)   Resp 17   LMP  (LMP Unknown) Comment: Cont. Bleeding  SpO2 100%   Physical Exam Vitals and nursing note reviewed.  Constitutional:      General: She is not in acute distress.    Appearance: She is well-developed. She is not diaphoretic.  HENT:     Head: Normocephalic and atraumatic.     Mouth/Throat:     Mouth: Mucous membranes are moist.     Pharynx: No oropharyngeal exudate or  posterior oropharyngeal erythema.     Comments: Left lower molar broken with likely abscess, no drainable area at this time. Pulmonary:     Effort: Pulmonary effort is normal.  Musculoskeletal:     Cervical back: Neck supple.  Lymphadenopathy:     Cervical: No cervical adenopathy.  Skin:    General: Skin is warm and dry.     Findings: No erythema or rash.  Neurological:     Mental Status: She is alert and oriented to person, place, and time.  Psychiatric:        Behavior: Behavior normal.    ED Results / Procedures / Treatments   Labs (all labs ordered are listed, but only abnormal results are displayed) Labs Reviewed - No data to display  EKG None  Radiology No results found.  Procedures Procedures   Medications Ordered in ED Medications - No data to display  ED Course  I have reviewed the triage vital signs and the nursing notes.  Pertinent labs & imaging results that were available during my care of the patient were reviewed by me and considered in my medical decision making (see chart for details).  Clinical Course as of 04/27/21 1557  Nancy Fetter Apr 27, 1264  2166 50 year old female with left lower dental pain and likely dental abscess.  No submandibular sublingual or swelling, no trismus.  Plan is to treat with Augmentin and follow-up with dentist. [LM]    Clinical Course User Index [LM] Roque Lias   MDM Rules/Calculators/A&P                           Final Clinical Impression(s) / ED Diagnoses Final diagnoses:  Dental abscess    Rx / DC Orders ED Discharge Orders          Ordered    amoxicillin-clavulanate (AUGMENTIN) 875-125 MG tablet  Every 12 hours        04/27/21 1549             Tacy Learn, PA-C 04/27/21 [place], [place], MD 05/01/21 [phone]

## 2021-09-03 ENCOUNTER — Other Ambulatory Visit: Payer: Self-pay | Admitting: Obstetrics and Gynecology

## 2021-09-03 DIAGNOSIS — N6310 Unspecified lump in the right breast, unspecified quadrant: Secondary | ICD-10-CM

## 2021-09-12 ENCOUNTER — Other Ambulatory Visit: Payer: Self-pay | Admitting: Registered Nurse

## 2021-09-12 DIAGNOSIS — R600 Localized edema: Secondary | ICD-10-CM

## 2021-09-15 ENCOUNTER — Ambulatory Visit
Admission: RE | Admit: 2021-09-15 | Discharge: 2021-09-15 | Disposition: A | Discharge status: Home / Self Care | Payer: 59 | Source: Ambulatory Visit | Attending: Registered Nurse | Admitting: Registered Nurse

## 2021-09-15 DIAGNOSIS — R600 Localized edema: Secondary | ICD-10-CM

## 2021-10-13 ENCOUNTER — Ambulatory Visit
Admission: RE | Admit: 2021-10-13 | Discharge: 2021-10-13 | Disposition: A | Discharge status: Home / Self Care | Payer: 59 | Source: Ambulatory Visit | Attending: Obstetrics and Gynecology | Admitting: Obstetrics and Gynecology

## 2021-10-13 DIAGNOSIS — N6310 Unspecified lump in the right breast, unspecified quadrant: Secondary | ICD-10-CM

## 2021-11-11 ENCOUNTER — Ambulatory Visit: Payer: 59 | Admitting: Diagnostic Neuroimaging

## 2022-10-01 ENCOUNTER — Other Ambulatory Visit: Payer: Self-pay | Admitting: Obstetrics and Gynecology

## 2022-10-01 DIAGNOSIS — R928 Other abnormal and inconclusive findings on diagnostic imaging of breast: Secondary | ICD-10-CM

## 2022-10-14 ENCOUNTER — Ambulatory Visit: Payer: 59

## 2022-10-14 ENCOUNTER — Ambulatory Visit
Admission: RE | Admit: 2022-10-14 | Discharge: 2022-10-14 | Disposition: A | Discharge status: Home / Self Care | Payer: 59 | Source: Ambulatory Visit | Attending: Obstetrics and Gynecology | Admitting: Obstetrics and Gynecology

## 2022-10-14 DIAGNOSIS — R928 Other abnormal and inconclusive findings on diagnostic imaging of breast: Secondary | ICD-10-CM

## 2022-10-19 ENCOUNTER — Encounter (INDEPENDENT_AMBULATORY_CARE_PROVIDER_SITE_OTHER): Payer: 59 | Admitting: Family Medicine

## 2022-10-19 DIAGNOSIS — Z0289 Encounter for other administrative examinations: Secondary | ICD-10-CM

## 2022-11-04 ENCOUNTER — Encounter (INDEPENDENT_AMBULATORY_CARE_PROVIDER_SITE_OTHER): Payer: Self-pay | Admitting: Family Medicine

## 2022-11-04 ENCOUNTER — Ambulatory Visit (INDEPENDENT_AMBULATORY_CARE_PROVIDER_SITE_OTHER): Payer: 59 | Admitting: Family Medicine

## 2022-11-04 VITALS — BP 145/82 | HR 56 | Temp 97.9°F | Ht 62.0 in | Wt 186.0 lb

## 2022-11-04 DIAGNOSIS — R5383 Other fatigue: Secondary | ICD-10-CM

## 2022-11-04 DIAGNOSIS — E538 Deficiency of other specified B group vitamins: Secondary | ICD-10-CM | POA: Diagnosis not present

## 2022-11-04 DIAGNOSIS — E7800 Pure hypercholesterolemia, unspecified: Secondary | ICD-10-CM | POA: Diagnosis not present

## 2022-11-04 DIAGNOSIS — E669 Obesity, unspecified: Secondary | ICD-10-CM

## 2022-11-04 DIAGNOSIS — R030 Elevated blood-pressure reading, without diagnosis of hypertension: Secondary | ICD-10-CM

## 2022-11-04 DIAGNOSIS — R739 Hyperglycemia, unspecified: Secondary | ICD-10-CM | POA: Diagnosis not present

## 2022-11-04 DIAGNOSIS — Z6834 Body mass index (BMI) 34.0-34.9, adult: Secondary | ICD-10-CM

## 2022-11-04 DIAGNOSIS — R0602 Shortness of breath: Secondary | ICD-10-CM

## 2022-11-04 DIAGNOSIS — E559 Vitamin D deficiency, unspecified: Secondary | ICD-10-CM

## 2022-11-04 DIAGNOSIS — Z1331 Encounter for screening for depression: Secondary | ICD-10-CM

## 2022-11-04 DIAGNOSIS — R7401 Elevation of levels of liver transaminase levels: Secondary | ICD-10-CM

## 2022-11-04 NOTE — Progress Notes (Deleted)
Referred by Dr. Shelia Media office.  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  Typical hours are 8-5 M-F.  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  Lives alone and eats with her kids once a month.  Desired weight of 150lbs; last time she was 160lbs was in 2021 when she was ill and dealing with transaminitis.  Skips breakfast frequently due to time.  Breakfast is fruit salad (blueberries, blackberries, pineapples, grapes) about 2 cups, peanut butter crackers and maybe an individual bag of cheez its.  Lunch is white bread, 2-3 slices ham, and a few chips (satisfied).  Dinner was burger (bacon, cheese, onions) and fries (air fried) (ate all and felt satisfied).  If she cooks at home she will eat brown rice, shrimp and broccoli (2 cups of mix) (used 1.5 bags of large shrimp and that ws divided into 4 portions). Does question her food choices and portions.

## 2022-11-05 LAB — COMPREHENSIVE METABOLIC PANEL
ALT: 24 IU/L (ref 0–32)
AST: 22 IU/L (ref 0–40)
Albumin/Globulin Ratio: 2.1 (ref 1.2–2.2)
Albumin: 4.5 g/dL (ref 3.9–4.9)
Alkaline Phosphatase: 52 IU/L (ref 44–121)
BUN/Creatinine Ratio: 13 (ref 9–23)
BUN: 9 mg/dL (ref 6–24)
Bilirubin Total: 0.6 mg/dL (ref 0.0–1.2)
CO2: 24 mmol/L (ref 20–29)
Calcium: 8.8 mg/dL (ref 8.7–10.2)
Chloride: 105 mmol/L (ref 96–106)
Creatinine, Ser: 0.71 mg/dL (ref 0.57–1.00)
Globulin, Total: 2.1 g/dL (ref 1.5–4.5)
Glucose: 137 mg/dL — ABNORMAL HIGH (ref 70–99)
Potassium: 4.4 mmol/L (ref 3.5–5.2)
Sodium: 137 mmol/L (ref 134–144)
Total Protein: 6.6 g/dL (ref 6.0–8.5)
eGFR: 104 mL/min/{1.73_m2} (ref >59)

## 2022-11-05 LAB — HEMOGLOBIN A1C
Est. average glucose Bld gHb Est-mCnc: 120 mg/dL
Hgb A1c MFr Bld: 5.8 % — ABNORMAL HIGH (ref 4.8–5.6)

## 2022-11-05 LAB — CBC WITH DIFFERENTIAL/PLATELET
Basophils Absolute: 0 10*3/uL (ref 0.0–0.2)
Basos: 1 %
EOS (ABSOLUTE): 0 10*3/uL (ref 0.0–0.4)
Eos: 1 %
Hematocrit: 39.2 % (ref 34.0–46.6)
Hemoglobin: 12.7 g/dL (ref 11.1–15.9)
Immature Grans (Abs): 0 10*3/uL (ref 0.0–0.1)
Immature Granulocytes: 0 %
Lymphocytes Absolute: 2.3 10*3/uL (ref 0.7–3.1)
Lymphs: 38 %
MCH: 30.8 pg (ref 26.6–33.0)
MCHC: 32.4 g/dL (ref 31.5–35.7)
MCV: 95 fL (ref 79–97)
Monocytes Absolute: 0.4 10*3/uL (ref 0.1–0.9)
Monocytes: 6 %
Neutrophils Absolute: 3.4 10*3/uL (ref 1.4–7.0)
Neutrophils: 54 %
Platelets: 259 10*3/uL (ref 150–450)
RBC: 4.12 x10E6/uL (ref 3.77–5.28)
RDW: 11.4 % — ABNORMAL LOW (ref 11.7–15.4)
WBC: 6.2 10*3/uL (ref 3.4–10.8)

## 2022-11-05 LAB — T3: T3, Total: 148 ng/dL (ref 71–180)

## 2022-11-05 LAB — LIPID PANEL WITH LDL/HDL RATIO
Cholesterol, Total: 198 mg/dL (ref 100–199)
HDL: 58 mg/dL (ref >39)
LDL Chol Calc (NIH): 121 mg/dL — ABNORMAL HIGH (ref 0–99)
LDL/HDL Ratio: 2.1 ratio (ref 0.0–3.2)
Triglycerides: 106 mg/dL (ref 0–149)
VLDL Cholesterol Cal: 19 mg/dL (ref 5–40)

## 2022-11-05 LAB — T4, FREE: Free T4: 1.13 ng/dL (ref 0.82–1.77)

## 2022-11-05 LAB — TSH: TSH: 1.92 u[IU]/mL (ref 0.450–4.500)

## 2022-11-05 LAB — FOLATE: Folate: 15.6 ng/mL (ref >3.0)

## 2022-11-05 LAB — INSULIN, RANDOM: INSULIN: 19.8 u[IU]/mL (ref 2.6–24.9)

## 2022-11-05 LAB — VITAMIN D 25 HYDROXY (VIT D DEFICIENCY, FRACTURES): Vit D, 25-Hydroxy: 26.4 ng/mL — ABNORMAL LOW (ref 30.0–100.0)

## 2022-11-05 LAB — VITAMIN B12: Vitamin B-12: 451 pg/mL (ref 232–1245)

## 2022-11-11 NOTE — Progress Notes (Signed)
Chief Complaint:   OBESITY Kayla [name] (MR# [id number]) is a 52 y.o. female who presents for evaluation and treatment of obesity and related comorbidities. Current BMI is Body mass index is 34.02 kg/m. Kayla [name] has been struggling with her weight for many years and has been unsuccessful in either losing weight, maintaining weight loss, or reaching her healthy weight goal.  Referred by Dr. Shelia Media office.  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  Typical hours are 8-5 M-F.  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  Lives alone and eats with her kids once a month.  Desired weight of 150lbs; last time she was 160lbs was in 2021 when she was ill and dealing with transaminitis.  Skips breakfast frequently due to time.  Breakfast is fruit salad (blueberries, blackberries, pineapples, grapes) about 2 cups, peanut butter crackers and maybe an individual bag of cheez its.  Lunch is white bread, 2-3 slices ham, and a few chips (satisfied).  Dinner was burger (bacon, cheese, onions) and fries (air fried) (ate all and felt satisfied).  If she cooks at home she will eat brown rice, shrimp and broccoli (2 cups of mix) (used 1.5 bags of large shrimp and that ws divided into 4 portions). Does question her food choices and portions.   Kayla [name] is currently in the action stage of change and ready to dedicate time achieving and maintaining a healthier weight. Kayla [name] is interested in becoming our patient and working on intensive lifestyle modifications including (but not limited to) diet and exercise for weight loss.  Kayla [name]'s habits were reviewed today and are as follows: Her family eats meals together, she thinks her family will eat healthier with her, her desired weight loss is 36 lbs, she started gaining weight unsure of when,  her heaviest weight ever was 189 pounds, she has significant food cravings issues, she snacks frequently in the evenings, she skips meals frequently, she is frequently drinking liquids with calories, she frequently makes poor food choices, she frequently eats larger portions than normal, and she struggles with emotional eating.  Depression Screen Kayla [name]'s Food and Mood (modified PHQ-9) score was 6.  Subjective:   1. Other fatigue Kayla [name] admits to daytime somnolence and admits to waking up still tired. Patient has a history of symptoms of daytime fatigue and morning fatigue. Kayla [name] generally gets 5 or 6 hours of sleep per night, and states that she has nightime awakenings and generally restful sleep. Snoring is present. Apneic episodes are not present. Epworth Sleepiness Score is 4.  EKG, sinus bradycardia.  2. SOBOE (shortness of breath on exertion) Kayla [name] notes increasing shortness of breath with exercising and seems to be worsening over time with weight gain. She notes getting out of breath sooner with activity than she used to. This has not gotten worse recently. Kayla [name] denies shortness of breath at rest or orthopnea.  3. Pure hypercholesterolemia Patient last LDL 128, HDL 52, triglycerides 94.  Patient is not on any medications.  4. Hyperglycemia Patient last fasting blood sugar 122. The only A1c is from 2 years ago.  5. Vitamin D deficiency Patient was previously on vitamin D.  Last level was 18.  6. B12 deficiency Patient is on a multivitamin, but complains of fatigue.  7. Transaminitis Patient had previously elevated  LFTs.  Gallbladder removed in 2021.  8. Elevated blood pressure reading The only other elevated blood pressures have been when the patient has been actively ill.  Assessment/Plan:   1. Other fatigue Kayla [name] does feel that her weight is causing her energy to be lower than it should be. Fatigue may be related to obesity, depression or many other causes. Labs  will be ordered, and in the meanwhile, Kayla [name] will focus on self care including making healthy food choices, increasing physical activity and focusing on stress reduction.  Check EKG, IC, labs today.  - EKG 12-Lead - Folate - TSH - T4, free - T3  2. SOBOE (shortness of breath on exertion) Kayla [name] does feel that she gets out of breath more easily that she used to when she exercises. Kayla [name]'s shortness of breath appears to be obesity related and exercise induced. She has agreed to work on weight loss and gradually increase exercise to treat her exercise induced shortness of breath. Will continue to monitor closely.  - CBC with Differential/Platelet  3. Pure hypercholesterolemia Check labs today.  - Lipid Panel With LDL/HDL Ratio  4. Hyperglycemia Check labs today.  - Hemoglobin A1c - Insulin, random  5. Vitamin D deficiency Check labs today.  - VITAMIN D 25 Hydroxy (Vit-D Deficiency, Fractures)  6. B12 deficiency Check labs today. - Vitamin B12  7. Transaminitis Check labs today.  - Comprehensive metabolic panel  8. Elevated blood pressure reading Follow-up blood pressure at next appointment.  9. Depression screening Kayla [name] had a positive depression screening. Depression is commonly associated with obesity and often results in emotional eating behaviors. We will monitor this closely and work on CBT to help improve the non-hunger eating patterns. Referral to Psychology may be required if no improvement is seen as she continues in our clinic.  10. Class 1 obesity with serious comorbidity and body mass index (BMI) of 34.0 to 34.9 in adult, unspecified obesity type Kayla [name] is currently in the action stage of change and her goal is to continue with weight loss efforts. I recommend Kayla [name] begin the structured treatment plan as follows:  She has agreed to the Category 2 Plan.  Exercise goals: No exercise has been prescribed at this time.   Behavioral modification  strategies: increasing lean protein intake, meal planning and cooking strategies, keeping healthy foods in the home, and planning for success.  She was informed of the importance of frequent follow-up visits to maximize her success with intensive lifestyle modifications for her multiple health conditions. She was informed we would discuss her lab results at her next visit unless there is a critical issue that needs to be addressed sooner. Kayla [name] agreed to keep her next visit at the agreed upon time to discuss these results.  Objective:   Blood pressure (!) 145/82, pulse (!) 56, temperature 97.9 F (36.6 C), height 5\' 2"  (1.575 m), weight 186 lb (84.4 kg), SpO2 97 %. Body mass index is 34.02 kg/m.  EKG: Normal sinus rhythm, rate 52 bpm.  Indirect Calorimeter completed today shows a VO2 of 214 and a REE of 1469.  Her calculated basal metabolic rate is [id number] thus her basal metabolic rate is worse than expected.  General: Cooperative, alert, well developed, in no acute distress. HEENT: Conjunctivae and lids unremarkable. Cardiovascular: Regular rhythm.  Lungs: Normal work of breathing. Neurologic: No focal deficits.   Lab Results  Component Value Date   CREATININE 0.71 11/04/2022   BUN 9 11/04/2022   NA 137 11/04/2022   K  4.4 11/04/2022   CL 105 11/04/2022   CO2 24 11/04/2022   Lab Results  Component Value Date   ALT 24 11/04/2022   AST 22 11/04/2022   ALKPHOS 52 11/04/2022   BILITOT 0.6 11/04/2022   Lab Results  Component Value Date   HGBA1C 5.8 (H) 11/04/2022   HGBA1C 5.0 09/03/2020   Lab Results  Component Value Date   INSULIN 19.8 11/04/2022   Lab Results  Component Value Date   TSH 1.920 11/04/2022   Lab Results  Component Value Date   CHOL 198 11/04/2022   HDL 58 11/04/2022   LDLCALC 121 (H) 11/04/2022   TRIG 106 11/04/2022   Lab Results  Component Value Date   WBC 6.2 11/04/2022   HGB 12.7 11/04/2022   HCT 39.2 11/04/2022   MCV 95 11/04/2022   PLT 259  11/04/2022   No results found for: "IRON", "TIBC", "FERRITIN"  Attestation Statements:   Reviewed by clinician on day of visit: allergies, medications, problem list, medical history, surgical history, family history, social history, and previous encounter notes.  Time spent on visit including pre-visit chart review and post-visit charting and care was 40 minutes.   I, Davy Pique, RMA, am acting as transcriptionist for Coralie Common, MD.  This is the patient's first visit at [facility]. The patient's NEW PATIENT PACKET was reviewed at length. Included in the packet: current and past health history, medications, allergies, ROS, gynecologic history (women only), surgical history, family history, social history, weight history, weight loss surgery history (for those that have had weight loss surgery), nutritional evaluation, mood and food questionnaire, PHQ9, Epworth questionnaire, sleep habits questionnaire, patient life and health improvement goals questionnaire. These will all be scanned into the patient's chart under media.   During the visit, I independently reviewed the patient's EKG, bioimpedance scale results, and indirect calorimeter results. I used this information to tailor a meal plan for the patient that will help her to lose weight and will improve her obesity-related conditions going forward. I performed a medically necessary appropriate examination and/or evaluation. I discussed the assessment and treatment plan with the patient. The patient was provided an opportunity to ask questions and all were answered. The patient agreed with the plan and demonstrated an understanding of the instructions. Labs were ordered at this visit and will be reviewed at the next visit unless more critical results need to be addressed immediately. Clinical information was updated and documented in the EMR.    I have reviewed the above documentation for accuracy and completeness, and I  agree with the above. - Coralie Common, MD

## 2022-11-17 ENCOUNTER — Ambulatory Visit (INDEPENDENT_AMBULATORY_CARE_PROVIDER_SITE_OTHER): Payer: 59 | Admitting: Family Medicine

## 2022-11-17 ENCOUNTER — Encounter (INDEPENDENT_AMBULATORY_CARE_PROVIDER_SITE_OTHER): Payer: Self-pay | Admitting: Family Medicine

## 2022-11-17 VITALS — BP 132/79 | HR 51 | Temp 98.7°F | Ht 62.0 in | Wt 181.0 lb

## 2022-11-17 DIAGNOSIS — Z6833 Body mass index (BMI) 33.0-33.9, adult: Secondary | ICD-10-CM

## 2022-11-17 DIAGNOSIS — E7849 Other hyperlipidemia: Secondary | ICD-10-CM

## 2022-11-17 DIAGNOSIS — E559 Vitamin D deficiency, unspecified: Secondary | ICD-10-CM

## 2022-11-17 DIAGNOSIS — E71313 Glutaric aciduria type II: Secondary | ICD-10-CM

## 2022-11-17 DIAGNOSIS — R7303 Prediabetes: Secondary | ICD-10-CM | POA: Diagnosis not present

## 2022-11-17 DIAGNOSIS — E669 Obesity, unspecified: Secondary | ICD-10-CM

## 2022-11-17 DIAGNOSIS — E538 Deficiency of other specified B group vitamins: Secondary | ICD-10-CM

## 2022-11-17 MED ORDER — VITAMIN D (ERGOCALCIFEROL) 1.25 MG (50000 UNIT) PO CAPS: 50000.0000 [IU] | ORAL_CAPSULE | ORAL | 0 refills | Status: DC

## 2022-11-17 NOTE — Progress Notes (Signed)
Chief Complaint:   OBESITY Kayla [name] is here to discuss her progress with her obesity treatment plan along with follow-up of her obesity related diagnoses. Kayla [name] is on the Category 2 Plan and states she is following her eating plan approximately 95% of the time. Kayla [name] states she is exercising 60 minutes 3 times per week.  Today's visit was #: 2 Starting weight: 186 lbs Starting date: 11/04/2022 Today's weight: 181 lbs Today's date: 11/17/2022 Total lbs lost to date: 5 lbs Total lbs lost since last in-office visit: 5 lbs  Interim History: Patient felt the first few weeks were an adjustment to getting more food in over the course of the day.  She did work in a salad instead of lunch sandwich.  She is used to having chips or something salty crunchy with the sandwich.  For snack calories she is choosing options from the snack suggestion list.  She has been mostly doing mozzarella cheese with tomatoes.  Using the scale to weigh meat to ensure she is getting enough.  Subjective:   1. Vitamin D deficiency Discussed labs with patient today. Patient is not on vitamin D OTC.  Patient is positive for fatigue.  2. Other hyperlipidemia Discussed labs with patient today. Patient's LDL 121, triglycerides 106, HDL 58, total cholesterol 98.  Patient is not on any medications.  3. Prediabetes New diagnosis. Discussed labs with patient today. Patient's A1c 5.8, insulin 19.8.  Patient denies cravings or hunger.  4. Vitamin B12 deficiency B12 level of 451, patient is positive for fatigue.  Patient is on OTC B12.  5. Glutaric acidemia, type 2 ([place]) Patient is on carnitine, riboflavin, protein fat restriction.  Patient is mindful of being a restrictive total protein.  Assessment/Plan:   1. Vitamin D deficiency Start- Vitamin D, Ergocalciferol, (DRISDOL) 1.25 MG (50000 UNIT) CAPS capsule; Take 1 capsule (50,000 Units total) by mouth every 7 (seven) days.  Dispense: 4 capsule; Refill: 0  2.  Other hyperlipidemia Continue lifestyle changes.  3. Prediabetes Pathophysiology of insulin resistance, prediabetes, diabetes discussed today with patient.  Cravings relatively controlled and no hunger.  4. Vitamin B12 deficiency Continue OTC.  No change in treatment.  Recheck in 1 year.  5. Glutaric acidemia, type 2 ([facility]) Follow-up on symptoms of myopathy and weakness as patient progresses through the program.  6. Obesity with current BMI of 33.2 Kayla [name] is currently in the action stage of change. As such, her goal is to continue with weight loss efforts. She has agreed to the Category 2 Plan.   Exercise goals: All adults should avoid inactivity. Some physical activity is better than none, and adults who participate in any amount of physical activity gain some health benefits.  Behavioral modification strategies: increasing lean protein intake, meal planning and cooking strategies, keeping healthy foods in the home, and planning for success.  Kayla [name] has agreed to follow-up with our clinic in 2-3 weeks. She was informed of the importance of frequent follow-up visits to maximize her success with intensive lifestyle modifications for her multiple health conditions.   Objective:   Blood pressure 132/79, pulse (!) 51, temperature 98.7 F (37.1 C), height 5\' 2"  (1.575 m), weight 181 lb (82.1 kg), SpO2 99 %. Body mass index is 33.11 kg/m.  General: Cooperative, alert, well developed, in no acute distress. HEENT: Conjunctivae and lids unremarkable. Cardiovascular: Regular rhythm.  Lungs: Normal work of breathing. Neurologic: No focal deficits.   Lab Results  Component Value Date   CREATININE 0.71 11/04/2022  BUN 9 11/04/2022   NA 137 11/04/2022   K 4.4 11/04/2022   CL 105 11/04/2022   CO2 24 11/04/2022   Lab Results  Component Value Date   ALT 24 11/04/2022   AST 22 11/04/2022   ALKPHOS 52 11/04/2022   BILITOT 0.6 11/04/2022   Lab Results  Component Value Date   HGBA1C  5.8 (H) 11/04/2022   HGBA1C 5.0 09/03/2020   Lab Results  Component Value Date   INSULIN 19.8 11/04/2022   Lab Results  Component Value Date   TSH 1.920 11/04/2022   Lab Results  Component Value Date   CHOL 198 11/04/2022   HDL 58 11/04/2022   LDLCALC 121 (H) 11/04/2022   TRIG 106 11/04/2022   Lab Results  Component Value Date   VD25OH 26.4 (L) 11/04/2022   Lab Results  Component Value Date   WBC 6.2 11/04/2022   HGB 12.7 11/04/2022   HCT 39.2 11/04/2022   MCV 95 11/04/2022   PLT 259 11/04/2022   No results found for: "IRON", "TIBC", "FERRITIN"  Attestation Statements:   Reviewed by clinician on day of visit: allergies, medications, problem list, medical history, surgical history, family history, social history, and previous encounter notes.  Time spent on visit including pre-visit chart review and post-visit care and charting was 42 minutes.   I, Davy Pique, RMA, am acting as transcriptionist for Coralie Common, MD.   I have reviewed the above documentation for accuracy and completeness, and I agree with the above. - Coralie Common, MD

## 2022-12-21 ENCOUNTER — Ambulatory Visit (INDEPENDENT_AMBULATORY_CARE_PROVIDER_SITE_OTHER): Payer: 59 | Admitting: Family Medicine

## 2022-12-21 ENCOUNTER — Encounter (INDEPENDENT_AMBULATORY_CARE_PROVIDER_SITE_OTHER): Payer: Self-pay | Admitting: Family Medicine

## 2022-12-21 VITALS — BP 142/84 | HR 53 | Temp 98.5°F | Ht 62.0 in | Wt 176.0 lb

## 2022-12-21 DIAGNOSIS — Z6832 Body mass index (BMI) 32.0-32.9, adult: Secondary | ICD-10-CM | POA: Diagnosis not present

## 2022-12-21 DIAGNOSIS — E669 Obesity, unspecified: Secondary | ICD-10-CM | POA: Diagnosis not present

## 2022-12-21 DIAGNOSIS — E559 Vitamin D deficiency, unspecified: Secondary | ICD-10-CM

## 2022-12-21 DIAGNOSIS — R7303 Prediabetes: Secondary | ICD-10-CM

## 2022-12-21 NOTE — Progress Notes (Signed)
Chief Complaint:   OBESITY Kayla [name] is here to discuss her progress with her obesity treatment plan along with follow-up of her obesity related diagnoses. Kayla [name] is on the Category 2 Plan and states she is following her eating plan approximately 90% of the time. Kayla [name] states she is exercising 20-30 minutes 2-3 times per week.  Today's visit was #: 3 Starting weight: 186 lbs Starting date: 11/04/2022 Today's weight: 176 lbs Today's date: 12/21/2022 Total lbs lost to date: 10 lbs Total lbs lost since last in-office visit: 5 lbs  Interim History: Patient has been trying to follow plan as strictly as she can.  She has a colonoscopy coming up which will limit the foods she can eat for a few days.  Only has this coming up nothing else medical.  Prepping for work to increase in intensity for the month of May.  She is going to have to meal prep more significantly for the next month.    Subjective:   1. Vitamin D deficiency Patient denies nausea, vomiting, muscle weakness but is positive for fatigue.  Patient found OTC vitamin D at [facility].  2. Prediabetes Patient's last A1c was elevated.  Patient is not on any medications.  Assessment/Plan:   1. Vitamin D deficiency Changed to vitamin D OTC.  Follow-up labs in 2 to 3 months.  2. Prediabetes Follow-up with labs in 2 to 3 months.  3. Obesity with current BMI of 32.3 Kayla [name] is currently in the action stage of change. As such, her goal is to continue with weight loss efforts. She has agreed to the Category 2 Plan.   Exercise goals:  As is.   Behavioral modification strategies: increasing lean protein intake, meal planning and cooking strategies, keeping healthy foods in the home, and planning for success.  Kayla [name] has agreed to follow-up with our clinic in 2 weeks. She was informed of the importance of frequent follow-up visits to maximize her success with intensive lifestyle modifications for her multiple health conditions.    Objective:   Blood pressure (!) 142/84, pulse (!) 53, temperature 98.5 F (36.9 C), height 5\' 2"  (1.575 m), weight 176 lb (79.8 kg), SpO2 98 %. Body mass index is 32.19 kg/m.  General: Cooperative, alert, well developed, in no acute distress. HEENT: Conjunctivae and lids unremarkable. Cardiovascular: Regular rhythm.  Lungs: Normal work of breathing. Neurologic: No focal deficits.   Lab Results  Component Value Date   CREATININE 0.71 11/04/2022   BUN 9 11/04/2022   NA 137 11/04/2022   K 4.4 11/04/2022   CL 105 11/04/2022   CO2 24 11/04/2022   Lab Results  Component Value Date   ALT 24 11/04/2022   AST 22 11/04/2022   ALKPHOS 52 11/04/2022   BILITOT 0.6 11/04/2022   Lab Results  Component Value Date   HGBA1C 5.8 (H) 11/04/2022   HGBA1C 5.0 09/03/2020   Lab Results  Component Value Date   INSULIN 19.8 11/04/2022   Lab Results  Component Value Date   TSH 1.920 11/04/2022   Lab Results  Component Value Date   CHOL 198 11/04/2022   HDL 58 11/04/2022   LDLCALC 121 (H) 11/04/2022   TRIG 106 11/04/2022   Lab Results  Component Value Date   VD25OH 26.4 (L) 11/04/2022   Lab Results  Component Value Date   WBC 6.2 11/04/2022   HGB 12.7 11/04/2022   HCT 39.2 11/04/2022   MCV 95 11/04/2022   PLT 259 11/04/2022   No results  found for: "IRON", "TIBC", "FERRITIN"  Attestation Statements:   Reviewed by clinician on day of visit: allergies, medications, problem list, medical history, surgical history, family history, social history, and previous encounter notes.  I have reviewed the above documentation for accuracy and completeness, and I agree with the above. - Reuben Likes, MD

## 2023-01-11 ENCOUNTER — Ambulatory Visit (INDEPENDENT_AMBULATORY_CARE_PROVIDER_SITE_OTHER): Payer: 59 | Admitting: Family Medicine

## 2023-01-11 ENCOUNTER — Encounter (INDEPENDENT_AMBULATORY_CARE_PROVIDER_SITE_OTHER): Payer: Self-pay | Admitting: Family Medicine

## 2023-01-11 VITALS — BP 108/74 | HR 66 | Temp 98.4°F | Ht 62.0 in | Wt 171.0 lb

## 2023-01-11 DIAGNOSIS — E669 Obesity, unspecified: Secondary | ICD-10-CM | POA: Diagnosis not present

## 2023-01-11 DIAGNOSIS — E559 Vitamin D deficiency, unspecified: Secondary | ICD-10-CM

## 2023-01-11 DIAGNOSIS — Z6831 Body mass index (BMI) 31.0-31.9, adult: Secondary | ICD-10-CM | POA: Diagnosis not present

## 2023-01-11 DIAGNOSIS — R7303 Prediabetes: Secondary | ICD-10-CM | POA: Diagnosis not present

## 2023-01-11 MED ORDER — VITAMIN D (ERGOCALCIFEROL) 1.25 MG (50000 UNIT) PO CAPS: 50000.0000 [IU] | ORAL_CAPSULE | ORAL | 0 refills | Status: DC

## 2023-01-11 NOTE — Progress Notes (Signed)
Chief Complaint:   OBESITY Kayla [name] is here to discuss her progress with her obesity treatment plan along with follow-up of her obesity related diagnoses. Kayla [name] is on the Category 2 Plan and states she is following her eating plan approximately 80-85% of the time. Kayla [name] states she is exercising and walking for 20-30 minutes 2 times per week.  Today's visit was #: 4 Starting weight: 186 lbs Starting date: 11/04/2022 Today's weight: 171 lbs Today's date: 01/11/2023 Total lbs lost to date: 15 Total lbs lost since last in-office visit: 5  Interim History: Patient has been on plan 80-85% of the time.  She meal prepped last week and this week.  This has made it easier for her to stick to the plan. Easier is knowing portion size and how much and what she can eat.  She is still learning this for her weekends.  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  Next few weeks she is going out of town to [place] and then another trip to [place] to celebrate her birthday.    Subjective:   1. Vitamin D deficiency Kayla [name] is on prescription vitamin D.  She denies nausea, vomiting, or muscle weakness but she notes fatigue.  Her last vitamin D level was of 26.4.  2. Prediabetes Kayla [name] is not on medications currently, and she was recently diagnosed.  Assessment/Plan:   1. Vitamin D deficiency We will refill prescription vitamin D for 1 month.  - Vitamin D, Ergocalciferol, (DRISDOL) 1.25 MG (50000 UNIT) CAPS capsule; Take 1 capsule (50,000 Units total) by mouth every 7 (seven) days.  Dispense: 4 capsule; Refill: 0  2. Prediabetes We will repeat labs in 2 months.  3. BMI 31.0-31.9,adult  4. Obesity with starting BMI of 34.1 Kayla [name] is currently in the action stage of change. As such, her goal is to continue with weight loss efforts. She has agreed to the Category 2 Plan.   Exercise goals: All adults should avoid inactivity. Some physical activity is better  than none, and adults who participate in any amount of physical activity gain some health benefits.  Behavioral modification strategies: increasing lean protein intake, meal planning and cooking strategies, keeping healthy foods in the home, and planning for success.  Kayla [name] has agreed to follow-up with our clinic in 3 weeks. She was informed of the importance of frequent follow-up visits to maximize her success with intensive lifestyle modifications for her multiple health conditions.   Objective:   Blood pressure 108/74, pulse 66, temperature 98.4 F (36.9 C), height 5\' 2"  (1.575 m), weight 171 lb (77.6 kg), SpO2 98 %. Body mass index is 31.28 kg/m.  General: Cooperative, alert, well developed, in no acute distress. HEENT: Conjunctivae and lids unremarkable. Cardiovascular: Regular rhythm.  Lungs: Normal work of breathing. Neurologic: No focal deficits.   Lab Results  Component Value Date   CREATININE 0.71 11/04/2022   BUN 9 11/04/2022   NA 137 11/04/2022   K 4.4 11/04/2022   CL 105 11/04/2022   CO2 24 11/04/2022   Lab Results  Component Value Date   ALT 24 11/04/2022   AST 22 11/04/2022   ALKPHOS 52 11/04/2022   BILITOT 0.6 11/04/2022   Lab Results  Component Value Date   HGBA1C 5.8 (H) 11/04/2022   HGBA1C 5.0 09/03/2020   Lab Results  Component Value Date   INSULIN 19.8 11/04/2022   Lab Results  Component Value Date   TSH  1.920 11/04/2022   Lab Results  Component Value Date   CHOL 198 11/04/2022   HDL 58 11/04/2022   LDLCALC 121 (H) 11/04/2022   TRIG 106 11/04/2022   Lab Results  Component Value Date   VD25OH 26.4 (L) 11/04/2022   Lab Results  Component Value Date   WBC 6.2 11/04/2022   HGB 12.7 11/04/2022   HCT 39.2 11/04/2022   MCV 95 11/04/2022   PLT 259 11/04/2022   No results found for: "IRON", "TIBC", "FERRITIN"  Attestation Statements:   Reviewed by clinician on day of visit: allergies, medications, problem list, medical history,  surgical history, family history, social history, and previous encounter notes.   I, Burt Knack, am acting as transcriptionist for Reuben Likes, MD.  I have reviewed the above documentation for accuracy and completeness, and I agree with the above. - Reuben Likes, MD

## 2023-02-03 ENCOUNTER — Ambulatory Visit (INDEPENDENT_AMBULATORY_CARE_PROVIDER_SITE_OTHER): Payer: 59 | Admitting: Family Medicine

## 2023-02-03 ENCOUNTER — Encounter (INDEPENDENT_AMBULATORY_CARE_PROVIDER_SITE_OTHER): Payer: Self-pay | Admitting: Family Medicine

## 2023-02-03 VITALS — BP 127/71 | HR 60 | Temp 98.3°F | Ht 62.0 in | Wt 169.0 lb

## 2023-02-03 DIAGNOSIS — E669 Obesity, unspecified: Secondary | ICD-10-CM

## 2023-02-03 DIAGNOSIS — E559 Vitamin D deficiency, unspecified: Secondary | ICD-10-CM

## 2023-02-03 DIAGNOSIS — Z6830 Body mass index (BMI) 30.0-30.9, adult: Secondary | ICD-10-CM

## 2023-02-03 DIAGNOSIS — R7303 Prediabetes: Secondary | ICD-10-CM | POA: Diagnosis not present

## 2023-02-03 MED ORDER — VITAMIN D (ERGOCALCIFEROL) 1.25 MG (50000 UNIT) PO CAPS: 50000.0000 [IU] | ORAL_CAPSULE | ORAL | 0 refills | Status: DC

## 2023-02-03 NOTE — Progress Notes (Unsigned)
Chief Complaint:   OBESITY Kayla [name] is here to discuss her progress with her obesity treatment plan along with follow-up of her obesity related diagnoses. Kayla [name] is on the Category 2 Plan and states she is following her eating plan approximately 65-70% of the time. Kayla [name] states she is walking the dog for 30-60 minutes 2 times per week.  Today's visit was #: 5 Starting weight: 186 lbs Starting date: 11/04/2022 Today's weight: 169 lbs Today's date: 02/03/2023 Total lbs lost to date: 17 Total lbs lost since last in-office visit: 2  Interim History: Patient leaves tomorrow for [place] to celebrate her birthday.  She did relatively well on the meal plan.  When she went and traveled up to [place] there was more eating out than she did previously.  She recognized she had less control over the that trip.  Is planning to stay mindful over food choices when she travels to [facility].  She will be in [place] from tomorrow until Monday (4 days).    Subjective:   1. Vitamin D deficiency Patient is on prescription vitamin D.  She denies nausea, vomiting, or muscle weakness but notes fatigue.  2. Prediabetes Patient's last A1c was 5.8.  She is not on medication.  Respective travel with less control of food choices recently.  Assessment/Plan:   1. Vitamin D deficiency Patient will continue prescription vitamin D, and we will refill for 1 month.  - Vitamin D, Ergocalciferol, (DRISDOL) 1.25 MG (50000 UNIT) CAPS capsule; Take 1 capsule (50,000 Units total) by mouth every 7 (seven) days.  Dispense: 4 capsule; Refill: 0  2. Prediabetes We will repeat fasting labs in August.  3. BMI 30.0-30.9,adult  4. Obesity with starting BMI of 34.1 Kayla [name] is currently in the action stage of change. As such, her goal is to continue with weight loss efforts. She has agreed to the Category 2 Plan.   Exercise goals: All adults should avoid inactivity. Some physical activity is better than none, and adults who  participate in any amount of physical activity gain some health benefits.  Behavioral modification strategies: increasing lean protein intake, meal planning and cooking strategies, keeping healthy foods in the home, and planning for success.  Kayla [name] has agreed to follow-up with our clinic in 4 weeks. She was informed of the importance of frequent follow-up visits to maximize her success with intensive lifestyle modifications for her multiple health conditions.   Objective:   Blood pressure 127/71, pulse 60, temperature 98.3 F (36.8 C), height 5\' 2"  (1.575 m), weight 169 lb (76.7 kg), SpO2 99 %. Body mass index is 30.91 kg/m.  General: Cooperative, alert, well developed, in no acute distress. HEENT: Conjunctivae and lids unremarkable. Cardiovascular: Regular rhythm.  Lungs: Normal work of breathing. Neurologic: No focal deficits.   Lab Results  Component Value Date   CREATININE 0.71 11/04/2022   BUN 9 11/04/2022   NA 137 11/04/2022   K 4.4 11/04/2022   CL 105 11/04/2022   CO2 24 11/04/2022   Lab Results  Component Value Date   ALT 24 11/04/2022   AST 22 11/04/2022   ALKPHOS 52 11/04/2022   BILITOT 0.6 11/04/2022   Lab Results  Component Value Date   HGBA1C 5.8 (H) 11/04/2022   HGBA1C 5.0 09/03/2020   Lab Results  Component Value Date   INSULIN 19.8 11/04/2022   Lab Results  Component Value Date   TSH 1.920 11/04/2022   Lab Results  Component Value Date   CHOL 198 11/04/2022  HDL 58 11/04/2022   LDLCALC 121 (H) 11/04/2022   TRIG 106 11/04/2022   Lab Results  Component Value Date   VD25OH 26.4 (L) 11/04/2022   Lab Results  Component Value Date   WBC 6.2 11/04/2022   HGB 12.7 11/04/2022   HCT 39.2 11/04/2022   MCV 95 11/04/2022   PLT 259 11/04/2022   No results found for: "IRON", "TIBC", "FERRITIN"  Attestation Statements:   Reviewed by clinician on day of visit: allergies, medications, problem list, medical history, surgical history, family  history, social history, and previous encounter notes.   I, Burt Knack, am acting as transcriptionist for Reuben Likes, MD.  I have reviewed the above documentation for accuracy and completeness, and I agree with the above. - Reuben Likes, MD

## 2023-03-04 ENCOUNTER — Encounter (INDEPENDENT_AMBULATORY_CARE_PROVIDER_SITE_OTHER): Payer: Self-pay | Admitting: Family Medicine

## 2023-03-04 ENCOUNTER — Telehealth (INDEPENDENT_AMBULATORY_CARE_PROVIDER_SITE_OTHER): Payer: 59 | Admitting: Family Medicine

## 2023-03-04 VITALS — BP 131/82 | HR 54 | Temp 98.1°F | Ht 62.0 in | Wt 167.0 lb

## 2023-03-04 DIAGNOSIS — R7303 Prediabetes: Secondary | ICD-10-CM | POA: Diagnosis not present

## 2023-03-04 DIAGNOSIS — E559 Vitamin D deficiency, unspecified: Secondary | ICD-10-CM | POA: Diagnosis not present

## 2023-03-04 DIAGNOSIS — E669 Obesity, unspecified: Secondary | ICD-10-CM | POA: Diagnosis not present

## 2023-03-04 DIAGNOSIS — Z6834 Body mass index (BMI) 34.0-34.9, adult: Secondary | ICD-10-CM | POA: Diagnosis not present

## 2023-03-04 DIAGNOSIS — Z6830 Body mass index (BMI) 30.0-30.9, adult: Secondary | ICD-10-CM

## 2023-03-04 MED ORDER — VITAMIN D (ERGOCALCIFEROL) 1.25 MG (50000 UNIT) PO CAPS: 50000.0000 [IU] | ORAL_CAPSULE | ORAL | 0 refills | Status: DC

## 2023-03-04 NOTE — Progress Notes (Signed)
TeleHealth Visit:  Due to the COVID-19 pandemic, this visit was completed with telemedicine (audio/video) technology to reduce patient and provider exposure as well as to preserve personal protective equipment.   [name] has verbally consented to this TeleHealth visit. The patient is located at home, the provider is located at home. The participants in this visit include the listed provider and patient. The visit was conducted today via MyChart video.   Chief Complaint: OBESITY Kayla [name] is here to discuss her progress with her obesity treatment plan along with follow-up of her obesity related diagnoses. Kayla [name] is on the Category 2 Plan and states she is following her eating plan approximately 85% of the time. Kayla [name] states she is walking and exercising for 30-45 minutes 2 times per week.  Today's visit was #: 6 Starting weight: 186 lbs Starting date: 11/04/2022 Today's weight: 167 lbs Today's date: 03/04/2023 Total lbs lost to date: 19 Total lbs lost since last in-office visit: 2  Interim History: Patient went to [facility] shortly after her last appointment.  She really enjoyed her time in [place].  She went to [facility] and did a bar crawl and ate and saw some of the sites. No upcoming plans for the next few weeks so is planning to commit to the meal plan more consistently and aim to be 100% compliant. She is working this weekend so realizes she needs to prepare her food and meals for her work shifts. Biggest obstacle in the next few weeks is ensuring there is food prepared or her and being mindful of her snacking.  Category 2 will be easiest to stay compliant to.  Subjective:   1. Vitamin D deficiency Patient denies nausea, vomiting, or muscle weakness but notes fatigue.  Her last vitamin D level was 26.4.  2. Prediabetes Patient's last A1c was 5.8 and insulin 19.8.  She is not on medications.  Assessment/Plan:   1. Vitamin D deficiency Patient will continue prescription vitamin D once  weekly, we will refill for 1 month.  - Vitamin D, Ergocalciferol, (DRISDOL) 1.25 MG (50000 UNIT) CAPS capsule; Take 1 capsule (50,000 Units total) by mouth every 7 (seven) days.  Dispense: 4 capsule; Refill: 0  2. Prediabetes Labs to be repeated in September with her PCP.  Patient will continue her category 2 plan with no change in meal plan.  3. BMI 30.0-30.9,adult  4. Obesity with starting BMI of 34.1 Kayla [name] is currently in the action stage of change. As such, her goal is to continue with weight loss efforts. She has agreed to the Category 2 Plan.   Exercise goals: Patient is to commit to physical activity 3 times per week in the next few weeks.  Behavioral modification strategies: increasing lean protein intake, meal planning and cooking strategies, keeping healthy foods in the home, and planning for success.  Kayla [name] has agreed to follow-up with our clinic in 4 weeks. She was informed of the importance of frequent follow-up visits to maximize her success with intensive lifestyle modifications for her multiple health conditions.  Objective:   VITALS: Per patient if applicable, see vitals. GENERAL: Alert and in no acute distress. CARDIOPULMONARY: No increased WOB. Speaking in clear sentences.  PSYCH: Pleasant and cooperative. Speech normal rate and rhythm. Affect is appropriate. Insight and judgement are appropriate. Attention is focused, linear, and appropriate.  NEURO: Oriented as arrived to appointment on time with no prompting.   Lab Results  Component Value Date   CREATININE 0.71 11/04/2022   BUN 9 11/04/2022  NA 137 11/04/2022   K 4.4 11/04/2022   CL 105 11/04/2022   CO2 24 11/04/2022   Lab Results  Component Value Date   ALT 24 11/04/2022   AST 22 11/04/2022   ALKPHOS 52 11/04/2022   BILITOT 0.6 11/04/2022   Lab Results  Component Value Date   HGBA1C 5.8 (H) 11/04/2022   HGBA1C 5.0 09/03/2020   Lab Results  Component Value Date   INSULIN 19.8 11/04/2022    Lab Results  Component Value Date   TSH 1.920 11/04/2022   Lab Results  Component Value Date   CHOL 198 11/04/2022   HDL 58 11/04/2022   LDLCALC 121 (H) 11/04/2022   TRIG 106 11/04/2022   Lab Results  Component Value Date   VD25OH 26.4 (L) 11/04/2022   Lab Results  Component Value Date   WBC 6.2 11/04/2022   HGB 12.7 11/04/2022   HCT 39.2 11/04/2022   MCV 95 11/04/2022   PLT 259 11/04/2022   No results found for: "IRON", "TIBC", "FERRITIN"  Attestation Statements:   Reviewed by clinician on day of visit: allergies, medications, problem list, medical history, surgical history, family history, social history, and previous encounter notes.   I, Burt Knack, am acting as transcriptionist for Reuben Likes, MD.  I have reviewed the above documentation for accuracy and completeness, and I agree with the above. - Reuben Likes, MD

## 2023-03-29 ENCOUNTER — Other Ambulatory Visit (INDEPENDENT_AMBULATORY_CARE_PROVIDER_SITE_OTHER): Payer: Self-pay | Admitting: Family Medicine

## 2023-03-29 DIAGNOSIS — E559 Vitamin D deficiency, unspecified: Secondary | ICD-10-CM

## 2023-03-31 ENCOUNTER — Encounter (INDEPENDENT_AMBULATORY_CARE_PROVIDER_SITE_OTHER): Payer: Self-pay | Admitting: Family Medicine

## 2023-03-31 ENCOUNTER — Ambulatory Visit (INDEPENDENT_AMBULATORY_CARE_PROVIDER_SITE_OTHER): Payer: 59 | Admitting: Family Medicine

## 2023-03-31 VITALS — BP 132/84 | HR 60 | Temp 98.4°F | Ht 62.0 in | Wt 170.0 lb

## 2023-03-31 DIAGNOSIS — E669 Obesity, unspecified: Secondary | ICD-10-CM | POA: Diagnosis not present

## 2023-03-31 DIAGNOSIS — R7303 Prediabetes: Secondary | ICD-10-CM | POA: Diagnosis not present

## 2023-03-31 DIAGNOSIS — E559 Vitamin D deficiency, unspecified: Secondary | ICD-10-CM

## 2023-03-31 DIAGNOSIS — Z6831 Body mass index (BMI) 31.0-31.9, adult: Secondary | ICD-10-CM

## 2023-03-31 MED ORDER — VITAMIN D (ERGOCALCIFEROL) 1.25 MG (50000 UNIT) PO CAPS: 50000.0000 [IU] | ORAL_CAPSULE | ORAL | 0 refills | Status: DC

## 2023-03-31 NOTE — Progress Notes (Signed)
Chief Complaint:   OBESITY Kayla [name] is here to discuss her progress with her obesity treatment plan along with follow-up of her obesity related diagnoses. Kayla [name] is on the Category 2 Plan and states she is following her eating plan approximately 75% of the time. Kayla [name] states she is walking and lifting weights for 30 minutes 2-3 times per week.  Today's visit was #: 7 Starting weight: 186 lbs Starting date: 11/04/2022 Today's weight: 170 lbs Today's date: 03/31/2023 Total lbs lost to date: 16 Total lbs lost since last in-office visit: 0  Interim History: Patient returns for follow up. She feels like she has been off track drinking more sugar sweetened beverages than she can.  Next few weeks she has nothing coming up- goal is get more on track with meal plan.  She doesn't have any planned trips or events. She's gotten back into more consistent snacking.  She is feeling like she is grazing like she did when she had her cycle.  She is getting all the food in at breakfast.   Subjective:   1. Vitamin D deficiency Patient is on prescription vitamin D.  She denies nausea, vomiting, or muscle weakness but notes fatigue.  2. Prediabetes Patient's last A1c was 5.8 in March.  Not on labs, and not being influenced by patient's anemia.  Assessment/Plan:   1. Vitamin D deficiency Patient will continue prescription vitamin D once weekly, and we will refill for 1 month.  - Vitamin D, Ergocalciferol, (DRISDOL) 1.25 MG (50000 UNIT) CAPS capsule; Take 1 capsule (50,000 Units total) by mouth every 7 (seven) days.  Dispense: 4 capsule; Refill: 0  2. Prediabetes Patient will have labs checked with her PCP this month.  3. Obesity with starting BMI of 34.1  4. BMI 31.0-31.9,adult Kayla [name] is currently in the action stage of change. As such, her goal is to continue with weight loss efforts. She has agreed to the Category 2 Plan.   Exercise goals: All adults should avoid inactivity. Some physical  activity is better than none, and adults who participate in any amount of physical activity gain some health benefits.  Behavioral modification strategies: increasing lean protein intake, meal planning and cooking strategies, keeping healthy foods in the home, and planning for success.  Kayla [name] has agreed to follow-up with our clinic in 3 weeks. She was informed of the importance of frequent follow-up visits to maximize her success with intensive lifestyle modifications for her multiple health conditions.   Objective:   Blood pressure 132/84, pulse 60, temperature 98.4 F (36.9 C), height 5\' 2"  (1.575 m), weight 170 lb (77.1 kg), SpO2 99%. Body mass index is 31.09 kg/m.  General: Cooperative, alert, well developed, in no acute distress. HEENT: Conjunctivae and lids unremarkable. Cardiovascular: Regular rhythm.  Lungs: Normal work of breathing. Neurologic: No focal deficits.   Lab Results  Component Value Date   CREATININE 0.71 11/04/2022   BUN 9 11/04/2022   NA 137 11/04/2022   K 4.4 11/04/2022   CL 105 11/04/2022   CO2 24 11/04/2022   Lab Results  Component Value Date   ALT 24 11/04/2022   AST 22 11/04/2022   ALKPHOS 52 11/04/2022   BILITOT 0.6 11/04/2022   Lab Results  Component Value Date   HGBA1C 5.8 (H) 11/04/2022   HGBA1C 5.0 09/03/2020   Lab Results  Component Value Date   INSULIN 19.8 11/04/2022   Lab Results  Component Value Date   TSH 1.920 11/04/2022   Lab Results  Component Value Date   CHOL 198 11/04/2022   HDL 58 11/04/2022   LDLCALC 121 (H) 11/04/2022   TRIG 106 11/04/2022   Lab Results  Component Value Date   VD25OH 26.4 (L) 11/04/2022   Lab Results  Component Value Date   WBC 6.2 11/04/2022   HGB 12.7 11/04/2022   HCT 39.2 11/04/2022   MCV 95 11/04/2022   PLT 259 11/04/2022   No results found for: "IRON", "TIBC", "FERRITIN"  Attestation Statements:   Reviewed by clinician on day of visit: allergies, medications, problem list,  medical history, surgical history, family history, social history, and previous encounter notes.   I, Burt Knack, am acting as transcriptionist for Reuben Likes, MD. I have reviewed the above documentation for accuracy and completeness, and I agree with the above. - Reuben Likes, MD

## 2023-04-21 ENCOUNTER — Ambulatory Visit (INDEPENDENT_AMBULATORY_CARE_PROVIDER_SITE_OTHER): Payer: 59 | Admitting: Family Medicine

## 2023-04-21 ENCOUNTER — Encounter (INDEPENDENT_AMBULATORY_CARE_PROVIDER_SITE_OTHER): Payer: Self-pay | Admitting: Family Medicine

## 2023-04-21 VITALS — BP 131/84 | HR 54 | Temp 98.2°F | Ht 62.0 in | Wt 169.0 lb

## 2023-04-21 DIAGNOSIS — E669 Obesity, unspecified: Secondary | ICD-10-CM

## 2023-04-21 DIAGNOSIS — R7303 Prediabetes: Secondary | ICD-10-CM | POA: Diagnosis not present

## 2023-04-21 DIAGNOSIS — Z6831 Body mass index (BMI) 31.0-31.9, adult: Secondary | ICD-10-CM

## 2023-04-21 DIAGNOSIS — E559 Vitamin D deficiency, unspecified: Secondary | ICD-10-CM | POA: Diagnosis not present

## 2023-04-21 MED ORDER — VITAMIN D (ERGOCALCIFEROL) 1.25 MG (50000 UNIT) PO CAPS: 50000.0000 [IU] | ORAL_CAPSULE | ORAL | 0 refills | Status: DC

## 2023-04-21 NOTE — Progress Notes (Signed)
Chief Complaint:   OBESITY Kayla [name] is here to discuss her progress with her obesity treatment plan along with follow-up of her obesity related diagnoses. Kayla [name] is on the Category 2 Plan and states she is following her eating plan approximately 80% of the time. Kayla [name] states she is doing cardio for 30 minutes 2 times per week.  Today's visit was #: 8 Starting weight: 186 lbs Starting date: 11/04/2022 Today's weight: 169 lbs Today's date: 04/21/2023 Total lbs lost to date: 17 Total lbs lost since last in-office visit: 1  Interim History: Since last appointment she has been not doing much.  She doesn't feel like she has it together.  She feels like she would do better with a personal chef. For the upcoming long weekend she is off and she will be relaxing.  It will be long days today and tomorrow then will be off.   Subjective:   1. Vitamin D deficiency Patient is on prescription vitamin D.  She denies nausea, vomiting, or muscle weakness but notes fatigue.  2. Prediabetes Patient's last A1c was 5.8 and insulin 19.8.  She is not on medications.  Assessment/Plan:   1. Vitamin D deficiency We will refill prescription vitamin D 50,000 IU once weekly for 1 month.  - Vitamin D, Ergocalciferol, (DRISDOL) 1.25 MG (50000 UNIT) CAPS capsule; Take 1 capsule (50,000 Units total) by mouth every 7 (seven) days.  Dispense: 4 capsule; Refill: 0  2. Prediabetes Patient will continue her meal plan; category 2 or logging.  3. BMI 31.0-31.9,adult  4. Obesity with starting BMI of 34.1 Kayla [name] is currently in the action stage of change. As such, her goal is to continue with weight loss efforts. She has agreed to the Category 2 Plan.   Exercise goals: All adults should avoid inactivity. Some physical activity is better than none, and adults who participate in any amount of physical activity gain some health benefits.  Behavioral modification strategies: increasing lean protein intake, meal  planning and cooking strategies, keeping healthy foods in the home, and planning for success.  Kayla [name] has agreed to follow-up with our clinic in 5 weeks. She was informed of the importance of frequent follow-up visits to maximize her success with intensive lifestyle modifications for her multiple health conditions.   Objective:   Blood pressure 131/84, pulse (!) 54, temperature 98.2 F (36.8 C), height 5\' 2"  (1.575 m), weight 169 lb (76.7 kg), SpO2 97%. Body mass index is 30.91 kg/m.  General: Cooperative, alert, well developed, in no acute distress. HEENT: Conjunctivae and lids unremarkable. Cardiovascular: Regular rhythm.  Lungs: Normal work of breathing. Neurologic: No focal deficits.   Lab Results  Component Value Date   CREATININE 0.71 11/04/2022   BUN 9 11/04/2022   NA 137 11/04/2022   K 4.4 11/04/2022   CL 105 11/04/2022   CO2 24 11/04/2022   Lab Results  Component Value Date   ALT 24 11/04/2022   AST 22 11/04/2022   ALKPHOS 52 11/04/2022   BILITOT 0.6 11/04/2022   Lab Results  Component Value Date   HGBA1C 5.8 (H) 11/04/2022   HGBA1C 5.0 09/03/2020   Lab Results  Component Value Date   INSULIN 19.8 11/04/2022   Lab Results  Component Value Date   TSH 1.920 11/04/2022   Lab Results  Component Value Date   CHOL 198 11/04/2022   HDL 58 11/04/2022   LDLCALC 121 (H) 11/04/2022   TRIG 106 11/04/2022   Lab Results  Component Value Date  VD25OH 26.4 (L) 11/04/2022   Lab Results  Component Value Date   WBC 6.2 11/04/2022   HGB 12.7 11/04/2022   HCT 39.2 11/04/2022   MCV 95 11/04/2022   PLT 259 11/04/2022   No results found for: "IRON", "TIBC", "FERRITIN"  Attestation Statements:   Reviewed by clinician on day of visit: allergies, medications, problem list, medical history, surgical history, family history, social history, and previous encounter notes.   I, Burt Knack, am acting as transcriptionist for Reuben Likes, MD.  I have  reviewed the above documentation for accuracy and completeness, and I agree with the above. - Reuben Likes, MD

## 2023-05-12 LAB — CBC AND DIFFERENTIAL
HCT: 37 (ref 36–46)
Hemoglobin: 12.3 (ref 12.0–16.0)
Platelets: 262 10*3/uL (ref 150–400)
WBC: 6.6

## 2023-05-12 LAB — BASIC METABOLIC PANEL
BUN: 13 (ref 4–21)
CO2: 24 — AB (ref 13–22)
Chloride: 100 (ref 99–108)
Creatinine: 0.8 (ref 0.5–1.1)
Glucose: 92
Potassium: 4 meq/L (ref 3.5–5.1)
Sodium: 137 (ref 137–147)

## 2023-05-12 LAB — LIPID PANEL
Cholesterol: 191 (ref 0–200)
HDL: 57 (ref 35–70)
LDL Cholesterol: 120
Triglycerides: 75 (ref 40–160)

## 2023-05-12 LAB — HEPATIC FUNCTION PANEL
ALT: 16 U/L (ref 7–35)
AST: 14 (ref 13–35)
Alkaline Phosphatase: 47 (ref 25–125)
Bilirubin, Total: 0.6

## 2023-05-12 LAB — VITAMIN D 25 HYDROXY (VIT D DEFICIENCY, FRACTURES): Vit D, 25-Hydroxy: 58.2

## 2023-05-12 LAB — TSH: TSH: 1.4 (ref 0.41–5.90)

## 2023-05-12 LAB — CBC: RBC: 3.84 — AB (ref 3.87–5.11)

## 2023-05-13 LAB — COMPREHENSIVE METABOLIC PANEL
Albumin: 4.2 (ref 3.5–5.0)
Calcium: 9 (ref 8.7–10.7)
EGFR: 91
Globulin: 2.5

## 2023-05-27 ENCOUNTER — Ambulatory Visit (INDEPENDENT_AMBULATORY_CARE_PROVIDER_SITE_OTHER): Payer: 59 | Admitting: Family Medicine

## 2023-05-27 ENCOUNTER — Encounter (INDEPENDENT_AMBULATORY_CARE_PROVIDER_SITE_OTHER): Payer: Self-pay | Admitting: Family Medicine

## 2023-05-27 VITALS — BP 138/92 | HR 83 | Temp 98.4°F | Ht 62.0 in | Wt 166.0 lb

## 2023-05-27 DIAGNOSIS — E7849 Other hyperlipidemia: Secondary | ICD-10-CM

## 2023-05-27 DIAGNOSIS — E559 Vitamin D deficiency, unspecified: Secondary | ICD-10-CM | POA: Diagnosis not present

## 2023-05-27 DIAGNOSIS — R7303 Prediabetes: Secondary | ICD-10-CM

## 2023-05-27 DIAGNOSIS — Z6830 Body mass index (BMI) 30.0-30.9, adult: Secondary | ICD-10-CM

## 2023-05-27 DIAGNOSIS — E669 Obesity, unspecified: Secondary | ICD-10-CM

## 2023-05-27 DIAGNOSIS — E66811 Obesity, class 1: Secondary | ICD-10-CM

## 2023-05-27 MED ORDER — VITAMIN D3 125 MCG (5000 UT) PO CAPS: 5000.0000 [IU] | ORAL_CAPSULE | Freq: Every day | ORAL | Status: AC

## 2023-05-27 NOTE — Progress Notes (Signed)
Chief Complaint:   OBESITY Kayla [name] is here to discuss her progress with her obesity treatment plan along with follow-up of her obesity related diagnoses. Kayla [name] is on the Category 2 Plan and states she is following her eating plan approximately 0% of the time. Kayla [name] states she was on the treadmill for 30 minutes 1 times per week for 1 week.  Today's visit was #: 9 Starting weight: 186 lbs Starting date: 11/04/2022 Today's weight: 166 lbs Today's date: 05/27/2023 Total lbs lost to date: 20 Total lbs lost since last in-office visit: 3  Interim History: Life has gotten the best of her the last month.  She is trying to eat at least 1x a day.  She knows she isn't following anything but right now she has lots of stuff going on.  She is trying to stay mindful of food choices. She wants to get back on track with meal plan and physical activity.   Subjective:   1. Other hyperlipidemia Patient's last LDL on her recent labs was 128, HDL 58, and triglycerides 956 at her PCP.  She is not on medications.  2. Prediabetes Patient's last A1c was 5.8 in March.  She is not on medications.  3. Vitamin D deficiency Patient is on prescription vitamin D, and she denies nausea, vomiting, or muscle weakness but notes fatigue.  Assessment/Plan:   1. Other hyperlipidemia We will follow-up on patient's FLP in January 2025.  2. Prediabetes We will check labs today, and we will follow-up at patient's next appointment.  - Hemoglobin A1c  3. Vitamin D deficiency Patient agreed to discontinue prescription vitamin D, and start vitamin D 5000 IU once daily OTC.  - Cholecalciferol (VITAMIN D3) 125 MCG (5000 UT) CAPS; Take 1 capsule (5,000 Units total) by mouth daily.  4. BMI 30.0-30.9,adult  5. Obesity with starting BMI of 34.1 Kayla [name] is currently in the action stage of change. As such, her goal is to continue with weight loss efforts. She has agreed to the Category 2 Plan.   Exercise goals: All  adults should avoid inactivity. Some physical activity is better than none, and adults who participate in any amount of physical activity gain some health benefits.  Behavioral modification strategies: increasing lean protein intake, meal planning and cooking strategies, and planning for success.  Kayla [name] has agreed to follow-up with our clinic in 3 to 4 weeks. She was informed of the importance of frequent follow-up visits to maximize her success with intensive lifestyle modifications for her multiple health conditions.   Kayla [name] was informed we would discuss her lab results at her next visit unless there is a critical issue that needs to be addressed sooner. Kayla [name] agreed to keep her next visit at the agreed upon time to discuss these results.  Objective:   Blood pressure (!) 138/92, pulse 83, temperature 98.4 F (36.9 C), height 5\' 2"  (1.575 m), weight 166 lb (75.3 kg), SpO2 99%. Body mass index is 30.36 kg/m.  General: Cooperative, alert, well developed, in no acute distress. HEENT: Conjunctivae and lids unremarkable. Cardiovascular: Regular rhythm.  Lungs: Normal work of breathing. Neurologic: No focal deficits.   Lab Results  Component Value Date   CREATININE 0.71 11/04/2022   BUN 9 11/04/2022   NA 137 11/04/2022   K 4.4 11/04/2022   CL 105 11/04/2022   CO2 24 11/04/2022   Lab Results  Component Value Date   ALT 24 11/04/2022   AST 22 11/04/2022   ALKPHOS 52 11/04/2022  BILITOT 0.6 11/04/2022   Lab Results  Component Value Date   HGBA1C 5.3 05/27/2023   HGBA1C 5.8 (H) 11/04/2022   HGBA1C 5.0 09/03/2020   Lab Results  Component Value Date   INSULIN 19.8 11/04/2022   Lab Results  Component Value Date   TSH 1.920 11/04/2022   Lab Results  Component Value Date   CHOL 198 11/04/2022   HDL 58 11/04/2022   LDLCALC 121 (H) 11/04/2022   TRIG 106 11/04/2022   Lab Results  Component Value Date   VD25OH 26.4 (L) 11/04/2022   Lab Results  Component Value Date    WBC 6.2 11/04/2022   HGB 12.7 11/04/2022   HCT 39.2 11/04/2022   MCV 95 11/04/2022   PLT 259 11/04/2022   No results found for: "IRON", "TIBC", "FERRITIN"  Attestation Statements:   Reviewed by clinician on day of visit: allergies, medications, problem list, medical history, surgical history, family history, social history, and previous encounter notes.   I, Burt Knack, am acting as transcriptionist for Reuben Likes, MD.  I have reviewed the above documentation for accuracy and completeness, and I agree with the above. - Reuben Likes, MD

## 2023-05-28 LAB — HEMOGLOBIN A1C
Est. average glucose Bld gHb Est-mCnc: 105 mg/dL
Hgb A1c MFr Bld: 5.3 % (ref 4.8–5.6)

## 2023-06-16 ENCOUNTER — Other Ambulatory Visit (INDEPENDENT_AMBULATORY_CARE_PROVIDER_SITE_OTHER): Payer: Self-pay

## 2023-06-24 ENCOUNTER — Ambulatory Visit (INDEPENDENT_AMBULATORY_CARE_PROVIDER_SITE_OTHER): Payer: 59 | Admitting: Family Medicine

## 2023-06-24 ENCOUNTER — Encounter (INDEPENDENT_AMBULATORY_CARE_PROVIDER_SITE_OTHER): Payer: Self-pay | Admitting: Family Medicine

## 2023-06-24 VITALS — BP 136/83 | HR 52 | Temp 98.5°F | Ht 62.0 in | Wt 171.0 lb

## 2023-06-24 DIAGNOSIS — Z6831 Body mass index (BMI) 31.0-31.9, adult: Secondary | ICD-10-CM | POA: Diagnosis not present

## 2023-06-24 DIAGNOSIS — R7303 Prediabetes: Secondary | ICD-10-CM

## 2023-06-24 DIAGNOSIS — E785 Hyperlipidemia, unspecified: Secondary | ICD-10-CM | POA: Insufficient documentation

## 2023-06-24 DIAGNOSIS — E669 Obesity, unspecified: Secondary | ICD-10-CM

## 2023-06-24 DIAGNOSIS — Z6834 Body mass index (BMI) 34.0-34.9, adult: Secondary | ICD-10-CM

## 2023-06-24 DIAGNOSIS — E7800 Pure hypercholesterolemia, unspecified: Secondary | ICD-10-CM

## 2023-06-24 NOTE — Assessment & Plan Note (Signed)
LDL at 120 mid September of this year.  HDL 57 which does help offset the LDL elevation.  Triglyerides controlled at 56. Not on medication.  Given minimal other medical issues no need to risk stratify.

## 2023-06-24 NOTE — Progress Notes (Signed)
[facility] MEDICAL WEIGHT [facility] HEALTHY WEIGHT & WELLNESS AT [place] [address] [place] [address] Dept: [phone] Dept Fax: [phone]  AT A GLANCE:  No data recorded No data recorded No data recorded No data recorded   SUBJECTIVE: Patient is still dealing with a decreased appetite.   Kayla [name] is here to discuss her progress with her obesity treatment plan. She is on the Category 2 Plan and states she is following her eating plan approximately 50 % of the time. She states she is not exercising.  OBJECTIVE: Visit Diagnoses: Problem List Items Addressed This Visit   None   Interim History: Patient has been very busy the last few weeks.  She finally just started eating again after the last appointment.  Her appetite has been very poor but she is realizes she hasn't been getting enough in throughout the day.    ASSESSMENT AND PLAN: Problem List Items Addressed This Visit       Other   Prediabetes - Primary    Last A1c 5.3 which is a significant improvement from A1c of 5.8.  She has not been on medication and has been able to make the change through lifestyle changes alone.  She is now able recognize that she has not been consistent with her nutrition intake.  We discussed the importance of eating which is more advantageous than not eating.       Hyperlipidemia    LDL at 120 mid September of this year.  HDL 57 which does help offset the LDL elevation.  Triglyerides controlled at 56. Not on medication.  Given minimal other medical issues no need to risk stratify.        Diet: [name] is currently in the action stage of change. As such, her goal is to maintain weight for now. She has agreed to Category 2 Plan.  We discussed how to look for higher protein snacks and disussed the journaling plan. We discussed the 10:1 ratio when reading a food label.  She realizes that having a variety of snack options would be advantageous to consistently  getting nutrition.     Exercise: [name] has been instructed that some exercise is better than none for weight loss and overall health benefits.   Behavior Modification:  We discussed the following Behavioral Modification Strategies today: increasing lean protein intake, increasing vegetables, and no skipping meals.   No follow-ups on file.Marland Kitchen She was informed of the importance of frequent follow up visits to maximize her success with intensive lifestyle modifications for her multiple health conditions.  Attestation Statements:   Reviewed by clinician on day of visit: allergies, medications, problem list, medical history, surgical history, family history, social history, and previous encounter notes.   Time spent on visit including pre-visit chart review and post-visit care and charting was 30 minutes.    Reuben Likes, MD

## 2023-06-24 NOTE — Assessment & Plan Note (Signed)
Last A1c 5.3 which is a significant improvement from A1c of 5.8.  She has not been on medication and has been able to make the change through lifestyle changes alone.  She is now able recognize that she has not been consistent with her nutrition intake.  We discussed the importance of eating which is more advantageous than not eating.

## 2023-06-25 ENCOUNTER — Other Ambulatory Visit (HOSPITAL_COMMUNITY): Payer: Self-pay

## 2023-06-29 ENCOUNTER — Other Ambulatory Visit (HOSPITAL_COMMUNITY): Payer: Self-pay

## 2023-07-27 ENCOUNTER — Ambulatory Visit (INDEPENDENT_AMBULATORY_CARE_PROVIDER_SITE_OTHER): Payer: 59 | Admitting: Family Medicine

## 2023-07-27 ENCOUNTER — Encounter (INDEPENDENT_AMBULATORY_CARE_PROVIDER_SITE_OTHER): Payer: Self-pay | Admitting: Family Medicine

## 2023-07-27 VITALS — BP 133/81 | HR 52 | Temp 98.0°F | Ht 62.0 in | Wt 165.0 lb

## 2023-07-27 DIAGNOSIS — E785 Hyperlipidemia, unspecified: Secondary | ICD-10-CM | POA: Diagnosis not present

## 2023-07-27 DIAGNOSIS — E559 Vitamin D deficiency, unspecified: Secondary | ICD-10-CM | POA: Insufficient documentation

## 2023-07-27 DIAGNOSIS — Z6830 Body mass index (BMI) 30.0-30.9, adult: Secondary | ICD-10-CM

## 2023-07-27 DIAGNOSIS — E669 Obesity, unspecified: Secondary | ICD-10-CM

## 2023-07-27 DIAGNOSIS — E7800 Pure hypercholesterolemia, unspecified: Secondary | ICD-10-CM

## 2023-07-27 DIAGNOSIS — Z6834 Body mass index (BMI) 34.0-34.9, adult: Secondary | ICD-10-CM

## 2023-07-27 NOTE — Assessment & Plan Note (Signed)
Patient has three bottles of vitamin d at home.  She denies nausea, vomiting and muscle weakness.  Her last Vitamin D level close to goal at 58.  She is on OTC.  Repeat level in February.

## 2023-07-27 NOTE — Progress Notes (Signed)
   SUBJECTIVE:  Chief Complaint: Obesity  Interim History: Patient has been working more over the last month.  Since last appointment she has made more of a conscious effort to eat at least 2 meals and walked more.  She has found a protein bar that she tried and liked.  She did eat over Thanksgiving and had a small mount of indulgence. She is leaving for [place] in a few days and is planning to celebrate her friend.   Geneen is here to discuss her progress with her obesity treatment plan. She is on the Category 2 Plan and states she is following her eating plan approximately 70-75 % of the time. She states she is walking  3-4 hours 4 times per week.   OBJECTIVE: Visit Diagnoses: Problem List Items Addressed This Visit       Other   Hyperlipidemia - Primary    Patient is being more conscious of food intake and food choices.  She is still undereating and getting only 2 meals a day in.  She mentions that she wants to work toward getting all three meals in. Will repeat labs in February.      Vitamin D deficiency    Patient has three bottles of vitamin d at home.  She denies nausea, vomiting and muscle weakness.  Her last Vitamin D level close to goal at 58.  She is on OTC.  Repeat level in February.       Vitals Temp: 98 F (36.7 C) BP: 133/81 Pulse Rate: (!) 52 SpO2: 100 %   Anthropometric Measurements Height: 5\' 2"  (1.575 m) Weight: 165 lb (74.8 kg) BMI (Calculated): 30.17 Weight at Last Visit: 171 lb Weight Lost Since Last Visit: 6 Weight Gained Since Last Visit: 0 Starting Weight: 186 lb Total Weight Loss (lbs): 21 lb (9.526 kg)   Body Composition  Body Fat %: 37.4 % Fat Mass (lbs): 62 lbs Muscle Mass (lbs): 98.4 lbs Total Body Water (lbs): 73.6 lbs Visceral Fat Rating : 8   Other Clinical Data Today's Visit #: 11 Starting Date: 11/04/22     ASSESSMENT AND PLAN:  Diet: Labrea is currently in the action stage of change. As such, her goal is to continue with  weight loss efforts. She has agreed to Category 2 Plan.  Exercise: Darion has been instructed that some exercise is better than none for weight loss and overall health benefits.   Behavior Modification:  We discussed the following Behavioral Modification Strategies today: increasing lean protein intake, increasing vegetables, meal planning and cooking strategies, and planning for success.   No follow-ups on file.Marland Kitchen She was informed of the importance of frequent follow up visits to maximize her success with intensive lifestyle modifications for her multiple health conditions.  Attestation Statements:   Reviewed by clinician on day of visit: allergies, medications, problem list, medical history, surgical history, family history, social history, and previous encounter notes.    Reuben Likes, MD

## 2023-07-27 NOTE — Assessment & Plan Note (Signed)
Patient is being more conscious of food intake and food choices.  She is still undereating and getting only 2 meals a day in.  She mentions that she wants to work toward getting all three meals in. Will repeat labs in February.

## 2023-09-07 ENCOUNTER — Ambulatory Visit (INDEPENDENT_AMBULATORY_CARE_PROVIDER_SITE_OTHER): Payer: 59 | Admitting: Family Medicine

## 2023-09-29 ENCOUNTER — Encounter (INDEPENDENT_AMBULATORY_CARE_PROVIDER_SITE_OTHER): Payer: Self-pay | Admitting: Family Medicine

## 2023-09-29 ENCOUNTER — Ambulatory Visit (INDEPENDENT_AMBULATORY_CARE_PROVIDER_SITE_OTHER): Payer: 59 | Admitting: Family Medicine

## 2023-09-29 VITALS — BP 131/79 | HR 63 | Temp 98.0°F | Ht 62.0 in | Wt 169.0 lb

## 2023-09-29 DIAGNOSIS — Z6830 Body mass index (BMI) 30.0-30.9, adult: Secondary | ICD-10-CM | POA: Diagnosis not present

## 2023-09-29 DIAGNOSIS — R638 Other symptoms and signs concerning food and fluid intake: Secondary | ICD-10-CM | POA: Diagnosis not present

## 2023-09-29 DIAGNOSIS — R7303 Prediabetes: Secondary | ICD-10-CM | POA: Diagnosis not present

## 2023-09-29 DIAGNOSIS — E66811 Obesity, class 1: Secondary | ICD-10-CM | POA: Diagnosis not present

## 2023-09-29 DIAGNOSIS — Z6834 Body mass index (BMI) 34.0-34.9, adult: Secondary | ICD-10-CM

## 2023-09-29 NOTE — Assessment & Plan Note (Signed)
 Last A1c of 5.3 in October of 2024.  She has been trying to be mindful of food intake and snack options.  She tends to gravitate toward snacks rather than meals. She is looking for snack options that will be both high in protein and easy to grab and go.  Needs repeat labs done in March/ April to assess A1c.

## 2023-09-29 NOTE — Progress Notes (Signed)
   SUBJECTIVE:  Chief Complaint: Obesity  Interim History: Patient had a good holiday season- spent time with her family and worked.  She is struggling with the cooking and the meals.  Biggest obstacle is figuring out the food and meals.  She grabs food from take out or restaurants most nights. She is finding the eating out tends to be the most likely options for her.  Kayla [name] is here to discuss her progress with her obesity treatment plan. She is on the Category 2 Plan and states she is following her eating plan approximately 25 % of the time. She states she is walking 3 hours for 4 days at work and walking on the treadmill for 30 minutes 2 times a week.   OBJECTIVE: Visit Diagnoses: Problem List Items Addressed This Visit       Other   Prediabetes   Last A1c of 5.3 in October of 2024.  She has been trying to be mindful of food intake and snack options.  She tends to gravitate toward snacks rather than meals. She is looking for snack options that will be both high in protein and easy to grab and go.  Needs repeat labs done in March/ April to assess A1c.       Abnormal food appetite   Has difficulty controlling her intake of indulgent foods.  Due to the significant time constraints of her numerous jobs she is often having to snack and to eat full meals and also is often her circadian rhythm.  This could be making contribution to her change of appetite.  We discussed meal plan and prep options for ready made meals in the area.      Class 1 obesity with body mass index (BMI) of 34.0 to 34.9 in adult - Primary   Starting weight: 171 on 11/04/22 BMR: 1469 Previous obesity management:  Body Fat %: 39.6% Starting Meal Plan: Category 2 Meal Plan needs: breaking into snackable or grab and go options due to time constraints  Patient is interested in looking into local readymade meal options so that way she has less time that she required to prepare food which she thinks will enhance her  adherence to the meal plan.      Other Visit Diagnoses       BMI 30.0-30.9,adult           No data recorded  No data recorded  No data recorded  No data recorded    ASSESSMENT AND PLAN:  Diet: Kayla [name] is currently in the action stage of change. As such, her goal is to continue with weight loss efforts. She has agreed to keeping a food journal and adhering to recommended goals of 1150-1250 calories and 85+ grams of protein daily  Exercise: Kayla [name] has been instructed that some exercise is better than none for weight loss and overall health benefits.   Behavior Modification:  We discussed the following Behavioral Modification Strategies today: increasing lean protein intake, increasing vegetables, meal planning and cooking strategies, and keep a strict food journal.   No follow-ups on file.Kayla [name] She was informed of the importance of frequent follow up visits to maximize her success with intensive lifestyle modifications for her multiple health conditions.  Attestation Statements:   Reviewed by clinician on day of visit: allergies, medications, problem list, medical history, surgical history, family history, social history, and previous encounter notes.    Kayla Cho, MD

## 2023-10-05 ENCOUNTER — Encounter (INDEPENDENT_AMBULATORY_CARE_PROVIDER_SITE_OTHER): Payer: Self-pay | Admitting: Family Medicine

## 2023-10-05 DIAGNOSIS — E66811 Obesity, class 1: Secondary | ICD-10-CM | POA: Insufficient documentation

## 2023-10-05 NOTE — Assessment & Plan Note (Addendum)
Starting weight: 171 on 11/04/22 BMR: 1469 Previous obesity management:  Body Fat %: 39.6% Starting Meal Plan: Category 2 Meal Plan needs: breaking into snackable or grab and go options due to time constraints  Patient is interested in looking into local readymade meal options so that way she has less time that she required to prepare food which she thinks will enhance her adherence to the meal plan.

## 2023-10-05 NOTE — Assessment & Plan Note (Signed)
Has difficulty controlling her intake of indulgent foods.  Due to the significant time constraints of her numerous jobs she is often having to snack and to eat full meals and also is often her circadian rhythm.  This could be making contribution to her change of appetite.  We discussed meal plan and prep options for ready made meals in the area.

## 2023-10-26 ENCOUNTER — Ambulatory Visit (INDEPENDENT_AMBULATORY_CARE_PROVIDER_SITE_OTHER): Payer: 59 | Admitting: Family Medicine

## 2023-10-26 ENCOUNTER — Encounter (INDEPENDENT_AMBULATORY_CARE_PROVIDER_SITE_OTHER): Payer: Self-pay | Admitting: Family Medicine

## 2023-10-26 VITALS — BP 130/86 | HR 55 | Temp 98.4°F | Ht 62.0 in | Wt 166.0 lb

## 2023-10-26 DIAGNOSIS — E669 Obesity, unspecified: Secondary | ICD-10-CM

## 2023-10-26 DIAGNOSIS — Z6830 Body mass index (BMI) 30.0-30.9, adult: Secondary | ICD-10-CM

## 2023-10-26 DIAGNOSIS — R7303 Prediabetes: Secondary | ICD-10-CM

## 2023-10-26 DIAGNOSIS — E66811 Obesity, class 1: Secondary | ICD-10-CM

## 2023-10-26 DIAGNOSIS — R030 Elevated blood-pressure reading, without diagnosis of hypertension: Secondary | ICD-10-CM | POA: Diagnosis not present

## 2023-10-26 NOTE — Assessment & Plan Note (Signed)
 Last A1c of 5.3 in October.  She has been working on increasing total protein and being mindful of simple carbohydrates and skipping meals.  Labs at next appointment.

## 2023-10-26 NOTE — Assessment & Plan Note (Signed)
 Patient BP elevated today.  She got into an argument with her daughter prior to her appointment.  Previous blood pressures are well controlled.  No chest pain, chest pressure or headache.  No medication implementation at this time.

## 2023-10-26 NOTE — Progress Notes (Signed)
   SUBJECTIVE:  Chief Complaint: Obesity  Interim History: Patient has been making time to cook more consistently.  She will eat chicken legs or drummettes.  She got some higher protein snacks that she enjoys.  She is trying to be more consistent with her food intake and choices.  She is aware of incorporating more nutritious choices in her intake.  She is going to [employer] with her friends next week for a [place] trip. She is occasionally still skipping meals- more often than not she will eat something but may not always get all the protein in.  Kayla [name] is here to discuss her progress with her obesity treatment plan. She is on the keeping a food journal and adhering to recommended goals of 1150-1250 calories and 85+ grams of protein and states she is following her eating plan approximately 75 % of the time. She states she is working 3 jobs.   OBJECTIVE: Visit Diagnoses: Problem List Items Addressed This Visit       Other   Prediabetes   Last A1c of 5.3 in October.  She has been working on increasing total protein and being mindful of simple carbohydrates and skipping meals.  Labs at next appointment.      Elevated blood pressure reading - Primary   Patient BP elevated today.  She got into an argument with her daughter prior to her appointment.  Previous blood pressures are well controlled.  No chest pain, chest pressure or headache.  No medication implementation at this time.       Vitals Temp: 98.4 F (36.9 C) BP: 130/86 Pulse Rate: (!) 55 SpO2: 98 %   Anthropometric Measurements Height: 5\' 2"  (1.575 m) Weight: 166 lb (75.3 kg) BMI (Calculated): 30.35 Weight at Last Visit: 169 lb Weight Lost Since Last Visit: 3 Weight Gained Since Last Visit: 0 Starting Weight: 171 lb Total Weight Loss (lbs): 5 lb (2.268 kg)   Body Composition  Body Fat %: 39.4 % Fat Mass (lbs): 65.6 lbs Muscle Mass (lbs): 95.6 lbs Total Body Water (lbs): 75.4 lbs Visceral Fat Rating :  9   Other Clinical Data Today's Visit #: 13 Starting Date: 11/04/22 Comments: 1150-1250/85+     ASSESSMENT AND PLAN:  Diet: Kayla [name] is currently in the action stage of change. As such, her goal is to continue with weight loss efforts and has agreed to keeping a food journal and adhering to recommended goals of 1150-1250 calories and 85 or more grams of protein daily.   Exercise:  All adults should avoid inactivity. Some activity is better than none, and adults who participate in any amount of physical activity, gain some health benefits.  We talked about increasing resistance training in the activity she is already doing.  She is also going to think about where to add in 10-15 minutes a couple of times a week for additional activity.  Behavior Modification:  We discussed the following Behavioral Modification Strategies today: increasing lean protein intake, increasing vegetables, no skipping meals, meal planning and cooking strategies, better snacking choices, and planning for success.   Return in about 3 weeks (around 11/16/2023) for fasting labs.. She was informed of the importance of frequent follow up visits to maximize her success with intensive lifestyle modifications for her multiple health conditions.  Attestation Statements:   Reviewed by clinician on day of visit: allergies, medications, problem list, medical history, surgical history, family history, social history, and previous encounter notes.   Kayla Likes, MD

## 2023-11-23 ENCOUNTER — Ambulatory Visit (INDEPENDENT_AMBULATORY_CARE_PROVIDER_SITE_OTHER): Admitting: Family Medicine

## 2023-12-07 ENCOUNTER — Encounter (INDEPENDENT_AMBULATORY_CARE_PROVIDER_SITE_OTHER): Payer: Self-pay | Admitting: Family Medicine

## 2023-12-07 ENCOUNTER — Ambulatory Visit (INDEPENDENT_AMBULATORY_CARE_PROVIDER_SITE_OTHER): Admitting: Family Medicine

## 2023-12-07 VITALS — BP 139/89 | HR 54 | Temp 98.3°F | Ht 62.0 in | Wt 172.0 lb

## 2023-12-07 DIAGNOSIS — E559 Vitamin D deficiency, unspecified: Secondary | ICD-10-CM

## 2023-12-07 DIAGNOSIS — E785 Hyperlipidemia, unspecified: Secondary | ICD-10-CM

## 2023-12-07 DIAGNOSIS — R232 Flushing: Secondary | ICD-10-CM | POA: Diagnosis not present

## 2023-12-07 DIAGNOSIS — R7303 Prediabetes: Secondary | ICD-10-CM | POA: Diagnosis not present

## 2023-12-07 DIAGNOSIS — E66811 Obesity, class 1: Secondary | ICD-10-CM

## 2023-12-07 DIAGNOSIS — Z6831 Body mass index (BMI) 31.0-31.9, adult: Secondary | ICD-10-CM

## 2023-12-07 DIAGNOSIS — E7849 Other hyperlipidemia: Secondary | ICD-10-CM

## 2023-12-07 MED ORDER — METFORMIN HCL 500 MG PO TABS: 500.0000 mg | ORAL_TABLET | Freq: Every day | ORAL | 0 refills | Status: DC

## 2023-12-07 NOTE — Progress Notes (Signed)
 SUBJECTIVE:  Chief Complaint: Obesity  Interim History: patient changed her shift to an earlier shift at [facility] [employer] and is still trying to get adjusted to that.  No she works 8:30p-12:30a.  She did try to cook last week and tried to eat on that for a while.  She made oven fried chicken, salad and keep in mind getting in a certain amount of daily nutrition.  She has been snacking more and having hot  flashes.  She recognizes she has been eating more sugar.    Kayla [name] is here to discuss her progress with her obesity treatment plan. She is on the keeping a food journal and adhering to recommended goals of 1150-1250 calories and 85+ grams of protein and states she is following her eating plan approximately 70-75 % of the time. She states she is not exercising, but is working at [employer].  OBJECTIVE: Visit Diagnoses: Problem List Items Addressed This Visit       Cardiovascular and Mediastinum   Hot flashes   Patient is currently experiencing hot flash symptoms.  She was encouraged to reach out to her GYN for further evaluation and discuss if any treatment options are appropriate for her.        Other   Prediabetes - Primary   Prior A1c in the prediabetic range.  She has been working on more mindful eating and limiting carbohydrate consumption.  Will repeat CMP, A1c and Insulin  level today.       Relevant Medications   metFORMIN  (GLUCOPHAGE ) 500 MG tablet   Other Relevant Orders   Comprehensive metabolic panel with GFR (Completed)   Hemoglobin A1c (Completed)   Insulin , random (Completed)   Hyperlipidemia   Patient is fasting today and will repeat lipid panel today.  Last LDL elevated at 120.  Not on medication.  The 10-year ASCVD risk score (Arnett DK, et al., 2019) is: 2.5%   Values used to calculate the score:     Age: 53 years     Sex: Female     Is Non-Hispanic African American: Yes     Diabetic: No     Tobacco smoker: No     Systolic Blood Pressure: 139 mmHg     Is BP treated:  No     HDL Cholesterol: 56 mg/dL     Total Cholesterol: 191 mg/dL Will plan to discuss results at next appointment.      Relevant Orders   Lipid Panel With LDL/HDL Ratio (Completed)   Vitamin D  deficiency   Patient has been taking vitamin d  supplementation.  No nausea, vomiting or muscle weakness.  Will repeat Vitamin D  level today and discuss treatment plan at next appointment.      Relevant Orders   VITAMIN D  25 Hydroxy (Vit-D Deficiency, Fractures) (Completed)   Class 1 obesity with body mass index (BMI) of 34.0 to 34.9 in adult   Anthropometric Measurements Height: 5\' 2"  (1.575 m) Weight: 172 lb (78 kg) BMI (Calculated): 31.45 Weight at Last Visit: 166 lb Weight Lost Since Last Visit: 0 Weight Gained Since Last Visit: 6 lb Starting Weight: 171 lb Total Weight Loss (lbs): 0 lb (0 kg) Body Composition  Body Fat %: 39.6 % Fat Mass (lbs): 68.2 lbs Muscle Mass (lbs): 99 lbs Total Body Water (lbs): 73.4 lbs Visceral Fat Rating : 9 Other Clinical Data Fasting: yes Labs: yes Today's Visit #: 14 Starting Date: 11/04/22 Comments: 1150/1250/85+       Relevant Medications   metFORMIN  (GLUCOPHAGE ) 500 MG tablet  Other Visit Diagnoses       BMI 31.0-31.9,adult           No data recorded    12/07/2023    8:00 AM 10/26/2023    2:48 PM 10/26/2023    2:00 PM  Vitals with BMI  Height 5\' 2"   5\' 2"   Weight 172 lbs  166 lbs  BMI 31.45  30.35  Systolic 139 130 295  Diastolic 89 86 85  Pulse 54  55         ASSESSMENT AND PLAN:  Diet: Kayla [name] is currently in the action stage of change. As such, her goal is to continue with weight loss efforts and has agreed to keeping a food journal and adhering to recommended goals of 1150-1250 calories and 85 or more grams protein daily.  Exercise:  For substantial health benefits, adults should do at least 150 minutes (2 hours and 30 minutes) a week of moderate-intensity, or 75 minutes (1 hour and 15 minutes) a week of  vigorous-intensity aerobic physical activity, or an equivalent combination of moderate- and vigorous-intensity aerobic activity. Aerobic activity should be performed in episodes of at least 10 minutes, and preferably, it should be spread throughout the week.  Behavior Modification:  We discussed the following Behavioral Modification Strategies today: increasing lean protein intake, decreasing simple carbohydrates, meal planning and cooking strategies, keeping healthy foods in the home, planning for success, and keep a strict food journal.   Return in about 4 weeks (around 01/04/2024).Aaron Aas She was informed of the importance of frequent follow up visits to maximize her success with intensive lifestyle modifications for her multiple health conditions.  Attestation Statements:   Reviewed by clinician on day of visit: allergies, medications, problem list, medical history, surgical history, family history, social history, and previous encounter notes.     Donaciano Frizzle, MD

## 2023-12-08 LAB — LIPID PANEL WITH LDL/HDL RATIO
Cholesterol, Total: 191 mg/dL (ref 100–199)
HDL: 56 mg/dL (ref >39)
LDL Chol Calc (NIH): 113 mg/dL — ABNORMAL HIGH (ref 0–99)
LDL/HDL Ratio: 2 ratio (ref 0.0–3.2)
Triglycerides: 126 mg/dL (ref 0–149)
VLDL Cholesterol Cal: 22 mg/dL (ref 5–40)

## 2023-12-08 LAB — VITAMIN D 25 HYDROXY (VIT D DEFICIENCY, FRACTURES): Vit D, 25-Hydroxy: 65.5 ng/mL (ref 30.0–100.0)

## 2023-12-08 LAB — COMPREHENSIVE METABOLIC PANEL WITH GFR
ALT: 22 IU/L (ref 0–32)
AST: 21 IU/L (ref 0–40)
Albumin: 4.1 g/dL (ref 3.9–4.9)
Alkaline Phosphatase: 47 IU/L (ref 44–121)
BUN/Creatinine Ratio: 18 (ref 9–23)
BUN: 15 mg/dL (ref 6–24)
Bilirubin Total: 0.6 mg/dL (ref 0.0–1.2)
CO2: 22 mmol/L (ref 20–29)
Calcium: 8.8 mg/dL (ref 8.7–10.2)
Chloride: 101 mmol/L (ref 96–106)
Creatinine, Ser: 0.83 mg/dL (ref 0.57–1.00)
Globulin, Total: 2.2 g/dL (ref 1.5–4.5)
Glucose: 109 mg/dL — ABNORMAL HIGH (ref 70–99)
Potassium: 3.8 mmol/L (ref 3.5–5.2)
Sodium: 138 mmol/L (ref 134–144)
Total Protein: 6.3 g/dL (ref 6.0–8.5)
eGFR: 86 mL/min/{1.73_m2} (ref >59)

## 2023-12-08 LAB — HEMOGLOBIN A1C
Est. average glucose Bld gHb Est-mCnc: 117 mg/dL
Hgb A1c MFr Bld: 5.7 % — ABNORMAL HIGH (ref 4.8–5.6)

## 2023-12-08 LAB — INSULIN, RANDOM: INSULIN: 7.8 u[IU]/mL (ref 2.6–24.9)

## 2023-12-15 NOTE — Assessment & Plan Note (Signed)
 Patient is fasting today and will repeat lipid panel today.  Last LDL elevated at 120.  Not on medication.  The 10-year ASCVD risk score (Arnett DK, et al., 2019) is: 2.5%   Values used to calculate the score:     Age: 53 years     Sex: Female     Is Non-Hispanic African American: Yes     Diabetic: No     Tobacco smoker: No     Systolic Blood Pressure: 139 mmHg     Is BP treated: No     HDL Cholesterol: 56 mg/dL     Total Cholesterol: 191 mg/dL Will plan to discuss results at next appointment.

## 2023-12-15 NOTE — Assessment & Plan Note (Signed)
 Prior A1c in the prediabetic range.  She has been working on more mindful eating and limiting carbohydrate consumption.  Will repeat CMP, A1c and Insulin  level today.

## 2023-12-15 NOTE — Assessment & Plan Note (Signed)
 Anthropometric Measurements Height: 5\' 2"  (1.575 m) Weight: 172 lb (78 kg) BMI (Calculated): 31.45 Weight at Last Visit: 166 lb Weight Lost Since Last Visit: 0 Weight Gained Since Last Visit: 6 lb Starting Weight: 171 lb Total Weight Loss (lbs): 0 lb (0 kg) Body Composition  Body Fat %: 39.6 % Fat Mass (lbs): 68.2 lbs Muscle Mass (lbs): 99 lbs Total Body Water (lbs): 73.4 lbs Visceral Fat Rating : 9 Other Clinical Data Fasting: yes Labs: yes Today's Visit #: 14 Starting Date: 11/04/22 Comments: 1150/1250/85+

## 2023-12-15 NOTE — Assessment & Plan Note (Signed)
 Patient is currently experiencing hot flash symptoms.  She was encouraged to reach out to her GYN for further evaluation and discuss if any treatment options are appropriate for her.

## 2023-12-15 NOTE — Assessment & Plan Note (Signed)
 Patient has been taking vitamin d  supplementation.  No nausea, vomiting or muscle weakness.  Will repeat Vitamin D  level today and discuss treatment plan at next appointment.

## 2024-01-11 ENCOUNTER — Ambulatory Visit (INDEPENDENT_AMBULATORY_CARE_PROVIDER_SITE_OTHER): Admitting: Family Medicine

## 2024-02-17 ENCOUNTER — Ambulatory Visit (INDEPENDENT_AMBULATORY_CARE_PROVIDER_SITE_OTHER): Admitting: Family Medicine

## 2024-02-17 ENCOUNTER — Encounter (INDEPENDENT_AMBULATORY_CARE_PROVIDER_SITE_OTHER): Payer: Self-pay | Admitting: Family Medicine

## 2024-02-17 VITALS — BP 141/93 | HR 55 | Temp 98.4°F | Ht 62.0 in | Wt 175.0 lb

## 2024-02-17 DIAGNOSIS — R030 Elevated blood-pressure reading, without diagnosis of hypertension: Secondary | ICD-10-CM

## 2024-02-17 DIAGNOSIS — Z6834 Body mass index (BMI) 34.0-34.9, adult: Secondary | ICD-10-CM

## 2024-02-17 DIAGNOSIS — E66811 Obesity, class 1: Secondary | ICD-10-CM

## 2024-02-17 DIAGNOSIS — R7303 Prediabetes: Secondary | ICD-10-CM | POA: Diagnosis not present

## 2024-02-17 DIAGNOSIS — Z6832 Body mass index (BMI) 32.0-32.9, adult: Secondary | ICD-10-CM

## 2024-02-17 MED ORDER — METFORMIN HCL 500 MG PO TABS: 500.0000 mg | ORAL_TABLET | Freq: Every day | ORAL | 0 refills | Status: DC

## 2024-02-17 NOTE — Assessment & Plan Note (Addendum)
 A1c elevated on labs done at last appointment.  She does report snacking more frequently.  We discussed ways to increase protein while being in control of calories.  Needs a refill of metformin .

## 2024-02-17 NOTE — Progress Notes (Signed)
 SUBJECTIVE:  Chief Complaint: Obesity  Interim History: Patient last appointment in April.  She mentions that she has been working, celebrating her birthday and Mother's Day.  Foodwise she has been at best 50% on plan.  She isn't making time to cook.  She mentions there is also a mental block.  No upcoming plans for the next month.  Kayla [name] is here to discuss her progress with her obesity treatment plan. She is on the Category 2 Plan and states she is following her eating plan approximately 40 % of the time. She states she is exercising 30-45 minutes 2 times per week.   OBJECTIVE:     02/17/2024    2:21 PM 02/17/2024    2:00 PM 12/07/2023    8:00 AM  Vitals with BMI  Height  5' 2 5' 2  Weight  175 lbs 172 lbs  BMI  32 31.45  Systolic 141 157 860  Diastolic 93 91 89  Pulse  55 54    Visit Diagnoses: Problem List Items Addressed This Visit       Other   Prediabetes   A1c elevated on labs done at last appointment.  She does report snacking more frequently.  We discussed ways to increase protein while being in control of calories.  Needs a refill of metformin .      Relevant Medications   metFORMIN  (GLUCOPHAGE ) 500 MG tablet   Class 1 obesity with body mass index (BMI) of 34.0 to 34.9 in adult   Relevant Medications   metFORMIN  (GLUCOPHAGE ) 500 MG tablet   Elevated blood pressure reading - Primary   BP elevated today.  No chest pain, chest pressure or headache.  Not on medication.  Follow up on BP at next appointment.      Other Visit Diagnoses       BMI 32.0-32.9,adult           No data recorded       ASSESSMENT AND PLAN: Assessment & Plan Elevated blood pressure reading BP elevated today.  No chest pain, chest pressure or headache.  Not on medication.  Follow up on BP at next appointment. Prediabetes A1c elevated on labs done at last appointment.  She does report snacking more frequently.  We discussed ways to increase protein while being in control of  calories.  Needs a refill of metformin . Obesity with starting BMI of 34.1 Anthropometric Measurements Height: 5' 2 (1.575 m) Weight: 175 lb (79.4 kg) BMI (Calculated): 32 Weight at Last Visit: 172lb Weight Lost Since Last Visit: 0lb Weight Gained Since Last Visit: 3lb Starting Weight: 171lb Total Weight Loss (lbs): 0 lb (0 kg) Body Composition  Body Fat %: 38.4 % Fat Mass (lbs): 67.2 lbs Muscle Mass (lbs): 102.4 lbs Total Body Water (lbs): 75.8 lbs Visceral Fat Rating : 9 Other Clinical Data Fasting: No Labs: No Today's Visit #: 15 Starting Date: 11/04/22  BMI 32.0-32.9,adult    Diet: [name] is currently in the action stage of change. As such, her goal is to continue with weight loss efforts and has agreed to the Category 2 Plan.   Exercise:  For substantial health benefits, adults should do at least 150 minutes (2 hours and 30 minutes) a week of moderate-intensity, or 75 minutes (1 hour and 15 minutes) a week of vigorous-intensity aerobic physical activity, or an equivalent combination of moderate- and vigorous-intensity aerobic activity. Aerobic activity should be performed in episodes of at least 10 minutes, and preferably, it should be spread throughout  the week.  Behavior Modification:  We discussed the following Behavioral Modification Strategies today: increasing lean protein intake, decreasing simple carbohydrates, meal planning and cooking strategies, keeping healthy foods in the home, and planning for success.   Return in about 4 weeks (around 03/16/2024).   She was informed of the importance of frequent follow up visits to maximize her success with intensive lifestyle modifications for her multiple health conditions.  Attestation Statements:   Reviewed by clinician on day of visit: allergies, medications, problem list, medical history, surgical history, family history, social history, and previous encounter notes.   Adelita Cho, MD

## 2024-02-17 NOTE — Assessment & Plan Note (Addendum)
 BP elevated today.  No chest pain, chest pressure or headache.  Not on medication.  Follow up on BP at next appointment.

## 2024-02-27 NOTE — Assessment & Plan Note (Signed)
 Anthropometric Measurements Height: 5' 2 (1.575 m) Weight: 175 lb (79.4 kg) BMI (Calculated): 32 Weight at Last Visit: 172lb Weight Lost Since Last Visit: 0lb Weight Gained Since Last Visit: 3lb Starting Weight: 171lb Total Weight Loss (lbs): 0 lb (0 kg) Body Composition  Body Fat %: 38.4 % Fat Mass (lbs): 67.2 lbs Muscle Mass (lbs): 102.4 lbs Total Body Water (lbs): 75.8 lbs Visceral Fat Rating : 9 Other Clinical Data Fasting: No Labs: No Today's Visit #: 15 Starting Date: 11/04/22

## 2024-03-21 ENCOUNTER — Ambulatory Visit (INDEPENDENT_AMBULATORY_CARE_PROVIDER_SITE_OTHER): Admitting: Family Medicine

## 2024-03-28 ENCOUNTER — Encounter (INDEPENDENT_AMBULATORY_CARE_PROVIDER_SITE_OTHER): Payer: Self-pay | Admitting: Family Medicine

## 2024-03-28 ENCOUNTER — Other Ambulatory Visit (INDEPENDENT_AMBULATORY_CARE_PROVIDER_SITE_OTHER): Payer: Self-pay | Admitting: Family Medicine

## 2024-03-28 ENCOUNTER — Ambulatory Visit (INDEPENDENT_AMBULATORY_CARE_PROVIDER_SITE_OTHER): Admitting: Family Medicine

## 2024-03-28 VITALS — BP 156/96 | HR 52 | Temp 98.1°F | Ht 62.0 in | Wt 176.0 lb

## 2024-03-28 DIAGNOSIS — E66811 Obesity, class 1: Secondary | ICD-10-CM

## 2024-03-28 DIAGNOSIS — I10 Essential (primary) hypertension: Secondary | ICD-10-CM | POA: Diagnosis not present

## 2024-03-28 DIAGNOSIS — E669 Obesity, unspecified: Secondary | ICD-10-CM

## 2024-03-28 DIAGNOSIS — Z6832 Body mass index (BMI) 32.0-32.9, adult: Secondary | ICD-10-CM | POA: Diagnosis not present

## 2024-03-28 DIAGNOSIS — R7303 Prediabetes: Secondary | ICD-10-CM

## 2024-03-28 MED ORDER — CHLORTHALIDONE 15 MG PO TABS: 15.0000 mg | ORAL_TABLET | Freq: Every day | ORAL | 0 refills | Status: DC

## 2024-03-28 NOTE — Progress Notes (Unsigned)
 SUBJECTIVE:  Chief Complaint: Obesity  Interim History: Patient has been trying to get more consistently on plan this time around.  She got protein waffles, protein sandwiches, and got some shakes.  She is trying to make higher protein meals when she is off.  She has been walking more or getting more activity in more frequently. Isn't sure if she is getting 85g of protein in daily.  Hasn't gotten any meals this week.  She is going to the beach for 4 days coming up.   Amreen is here to discuss her progress with her obesity treatment plan. She is on the Category 2 Plan and states she is following her eating plan approximately 75 % of the time. She states she is exercising 45-60 minutes 2-3 times per week.   OBJECTIVE: Visit Diagnoses: Problem List Items Addressed This Visit       Other   Prediabetes   Class 1 obesity with body mass index (BMI) of 34.0 to 34.9 in adult   Other Visit Diagnoses       Primary hypertension    -  Primary     BMI 32.0-32.9,adult           Vitals Temp: 98.1 F (36.7 C) BP: (!) 156/96 Pulse Rate: (!) 52 SpO2: 99 %   Anthropometric Measurements Height: 5' 2 (1.575 m) Weight: 176 lb (79.8 kg) BMI (Calculated): 32.18 Weight at Last Visit: 175 lb Weight Lost Since Last Visit: 0 Weight Gained Since Last Visit: 1 Starting Weight: 171 lb Total Weight Loss (lbs): 0 lb (0 kg)   Body Composition  Body Fat %: 41.1 % Fat Mass (lbs): 72.6 lbs Muscle Mass (lbs): 98.8 lbs Total Body Water (lbs): 75.8 lbs Visceral Fat Rating : 10   Other Clinical Data Today's Visit #: 16 Starting Date: 11/04/22 Comments: Cat 2     ASSESSMENT AND PLAN: Assessment & Plan Primary hypertension Blood pressure elevated today and at previous appointment.  Upon review patient has had intermittent elevated blood pressure since starting at healthy weight and wellness.  We discussed that in addition to lifestyle changes a small dose of medication may be warranted at  this time to help control blood pressure to normal limits.  We will start chlorthalidone  15 mg daily and reassess blood pressure at next appointment. Prediabetes A1c in April of 5.7 which was up from 5.3 previously.  Patient is working on lifestyle changes and we discussed the importance of incorporation of higher protein meals or snacks throughout the day to ensure adequate nutrition daily.  After going to the beach patient is anticipating going to [employer] to get more premade meals.  We will need repeat labs in October to reassess A1c at that time. Obesity with starting BMI of 34.1  BMI 32.0-32.9,adult    Diet: Jolynne is currently in the action stage of change. As such, her goal is to continue with weight loss efforts and has agreed to the Category 2 Plan.   Exercise:  For substantial health benefits, adults should do at least 150 minutes (2 hours and 30 minutes) a week of moderate-intensity, or 75 minutes (1 hour and 15 minutes) a week of vigorous-intensity aerobic physical activity, or an equivalent combination of moderate- and vigorous-intensity aerobic activity. Aerobic activity should be performed in episodes of at least 10 minutes, and preferably, it should be spread throughout the week.  Behavior Modification:  We discussed the following Behavioral Modification Strategies today: increasing lean protein intake, decreasing simple  carbohydrates, increasing vegetables, meal planning and cooking strategies, keeping healthy foods in the home, and planning for success.  Return in about 4 weeks (around 04/25/2024).   She was informed of the importance of frequent follow up visits to maximize her success with intensive lifestyle modifications for her multiple health conditions.  Attestation Statements:   Reviewed by clinician on day of visit: allergies, medications, problem list, medical history, surgical history, family history, social history, and previous encounter notes.      Adelita Cho, MD

## 2024-03-29 NOTE — Assessment & Plan Note (Signed)
 A1c in April of 5.7 which was up from 5.3 previously.  Patient is working on lifestyle changes and we discussed the importance of incorporation of higher protein meals or snacks throughout the day to ensure adequate nutrition daily.  After going to the beach patient is anticipating going to [employer] to get more premade meals.  We will need repeat labs in October to reassess A1c at that time.

## 2024-04-21 ENCOUNTER — Other Ambulatory Visit (INDEPENDENT_AMBULATORY_CARE_PROVIDER_SITE_OTHER): Payer: Self-pay | Admitting: Family Medicine

## 2024-04-21 DIAGNOSIS — I10 Essential (primary) hypertension: Secondary | ICD-10-CM

## 2024-04-27 ENCOUNTER — Ambulatory Visit (INDEPENDENT_AMBULATORY_CARE_PROVIDER_SITE_OTHER): Admitting: Family Medicine

## 2024-04-27 ENCOUNTER — Encounter (INDEPENDENT_AMBULATORY_CARE_PROVIDER_SITE_OTHER): Payer: Self-pay | Admitting: Family Medicine

## 2024-04-27 VITALS — BP 143/90 | HR 61 | Temp 98.3°F | Ht 62.0 in | Wt 176.0 lb

## 2024-04-27 DIAGNOSIS — E669 Obesity, unspecified: Secondary | ICD-10-CM

## 2024-04-27 DIAGNOSIS — Z6832 Body mass index (BMI) 32.0-32.9, adult: Secondary | ICD-10-CM

## 2024-04-27 DIAGNOSIS — R7303 Prediabetes: Secondary | ICD-10-CM

## 2024-04-27 DIAGNOSIS — I10 Essential (primary) hypertension: Secondary | ICD-10-CM

## 2024-04-27 DIAGNOSIS — Z6834 Body mass index (BMI) 34.0-34.9, adult: Secondary | ICD-10-CM

## 2024-04-27 MED ORDER — METFORMIN HCL 500 MG PO TABS: 500.0000 mg | ORAL_TABLET | Freq: Every day | ORAL | 0 refills | Status: DC

## 2024-04-27 NOTE — Progress Notes (Signed)
 SUBJECTIVE:  Chief Complaint: Obesity  Interim History: Patient let go one of her part time jobs since last appointment.  She stopped taking her blood pressure medication due to muscle aches.  Patient is sleeping more, cooking and taking care of staying more consistent to her meal plan. Occasionally hungry.  When she is home she does try to fix breakfast. No significant plans for the month of September.  Denies any changes that need to be made food wise.   Kayla [name] is here to discuss her progress with her obesity treatment plan. She is on the Category 2 Plan and states she is following her eating plan approximately 75 % of the time. She states she is exercising 60-110+ minutes 2-3 times per week.   OBJECTIVE: Visit Diagnoses: Problem List Items Addressed This Visit       Other   Prediabetes - Primary   Relevant Medications   metFORMIN  (GLUCOPHAGE ) 500 MG tablet   Class 1 obesity with body mass index (BMI) of 34.0 to 34.9 in adult   Relevant Medications   metFORMIN  (GLUCOPHAGE ) 500 MG tablet   Other Visit Diagnoses       Primary hypertension         BMI 32.0-32.9,adult           Vitals Temp: 98.3 F (36.8 C) BP: (!) 143/90 Pulse Rate: 61 SpO2: 97 %   Anthropometric Measurements Height: 5' 2 (1.575 m) Weight: 176 lb (79.8 kg) BMI (Calculated): 32.18 Weight at Last Visit: 176 lb Weight Lost Since Last Visit: 0 Weight Gained Since Last Visit: 0 Starting Weight: 171 lb Total Weight Loss (lbs): 0 lb (0 kg)   Body Composition  Body Fat %: 39.1 % Fat Mass (lbs): 69.2 lbs Muscle Mass (lbs): 102.2 lbs Total Body Water (lbs): 76.2 lbs Visceral Fat Rating : 10   Other Clinical Data Today's Visit #: 17 Starting Date: 11/04/22 Comments: Cat 2     ASSESSMENT AND PLAN: Assessment & Plan Prediabetes Tolerating metformin  with no GI side effects mentioned.  Needs a refill today.  Patient is working on limiting simple carbohydrates and increasing total protein  intake.  Primary hypertension Patient no longer taking antihypertensive due to side effects.  No chest pain, chest pressure or headache reported.  Medication list updated.  Blood pressure still elevated but improved from last appointment- patient stress level decreased since she quit one of her jobs. Obesity with starting BMI of 34.1  BMI 32.0-32.9,adult    Diet: Yajahira is currently in the action stage of change. As such, her goal is to continue with weight loss efforts and has agreed to the Category 2 Plan.   Exercise:  For substantial health benefits, adults should do at least 150 minutes (2 hours and 30 minutes) a week of moderate-intensity, or 75 minutes (1 hour and 15 minutes) a week of vigorous-intensity aerobic physical activity, or an equivalent combination of moderate- and vigorous-intensity aerobic activity. Aerobic activity should be performed in episodes of at least 10 minutes, and preferably, it should be spread throughout the week.  Behavior Modification:  We discussed the following Behavioral Modification Strategies today: increasing lean protein intake, decreasing simple carbohydrates, increasing vegetables, meal planning and cooking strategies, and planning for success.   No follow-ups on file.   She was informed of the importance of frequent follow up visits to maximize her success with intensive lifestyle modifications for her multiple health conditions.  Attestation Statements:   Reviewed by clinician on day of visit: allergies,  medications, problem list, medical history, surgical history, family history, social history, and previous encounter notes.     Adelita Cho, MD

## 2024-04-29 NOTE — Assessment & Plan Note (Signed)
 Tolerating metformin  with no GI side effects mentioned.  Needs a refill today.  Patient is working on limiting simple carbohydrates and increasing total protein intake.

## 2024-06-12 ENCOUNTER — Ambulatory Visit (INDEPENDENT_AMBULATORY_CARE_PROVIDER_SITE_OTHER): Admitting: Family Medicine

## 2024-06-12 ENCOUNTER — Encounter (INDEPENDENT_AMBULATORY_CARE_PROVIDER_SITE_OTHER): Payer: Self-pay | Admitting: Family Medicine

## 2024-06-12 VITALS — BP 152/94 | HR 49 | Temp 98.6°F | Ht 62.0 in | Wt 178.0 lb

## 2024-06-12 DIAGNOSIS — E669 Obesity, unspecified: Secondary | ICD-10-CM | POA: Diagnosis not present

## 2024-06-12 DIAGNOSIS — Z6832 Body mass index (BMI) 32.0-32.9, adult: Secondary | ICD-10-CM

## 2024-06-12 DIAGNOSIS — E559 Vitamin D deficiency, unspecified: Secondary | ICD-10-CM

## 2024-06-12 DIAGNOSIS — R7303 Prediabetes: Secondary | ICD-10-CM | POA: Diagnosis not present

## 2024-06-12 DIAGNOSIS — Z6834 Body mass index (BMI) 34.0-34.9, adult: Secondary | ICD-10-CM

## 2024-06-12 MED ORDER — METFORMIN HCL 500 MG PO TABS: 1000.0000 mg | ORAL_TABLET | Freq: Every day | ORAL | 0 refills | Status: AC

## 2024-06-12 NOTE — Progress Notes (Signed)
 SUBJECTIVE:  Chief Complaint: Obesity  Interim History: Patient has been still working her two jobs since last appointment.  She did end up going back to [employer] and is planning to go back again this week.  She is anticipating going back in a couple of days. She has been eating breakfast consistently and then eats here and there over the last 3-4 weeks.  She is eating breakfast and lunch and dinner but does occasionally skip lunch.  She will do smoothies with protein in it.    Kayla [name] is here to discuss her progress with her obesity treatment plan. She is on the Category 2 Plan and states she is following her eating plan approximately 70-75 % of the time. She states she is exercising 30 minutes 1 time per week.   OBJECTIVE: Visit Diagnoses: Problem List Items Addressed This Visit       Other   Prediabetes - Primary   Relevant Medications   metFORMIN  (GLUCOPHAGE ) 500 MG tablet   Vitamin D  deficiency   Class 1 obesity with body mass index (BMI) of 34.0 to 34.9 in adult   Relevant Medications   metFORMIN  (GLUCOPHAGE ) 500 MG tablet   Other Visit Diagnoses       BMI 32.0-32.9,adult           Vitals Temp: 98.6 F (37 C) BP: (!) 152/94 Pulse Rate: (!) 49 SpO2: 97 %   Anthropometric Measurements Height: 5' 2 (1.575 m) Weight: 178 lb (80.7 kg) BMI (Calculated): 32.55 Weight at Last Visit: 176 lb Weight Lost Since Last Visit: 0 Weight Gained Since Last Visit: 2 Starting Weight: 171 lb Total Weight Loss (lbs): 0 lb (0 kg)   Body Composition  Body Fat %: 40.8 % Fat Mass (lbs): 73 lbs Muscle Mass (lbs): 100.4 lbs Total Body Water (lbs): 75.6 lbs Visceral Fat Rating : 10   Other Clinical Data Today's Visit #: 18 Starting Date: 11/04/22 Comments: Cat 2     ASSESSMENT AND PLAN: Assessment & Plan Prediabetes Patient is tolerating metformin  and needs a refill today. Increase dosage as patient is not experiencing any side effects but is having  cravings.  Will follow-up at next appointment as to whether or not patient's hunger or cravings are controlled and the patient is experiencing side effects may need to go to twice a day dosage. Vitamin D  deficiency Doing well on vitamin D  supplementation.  No change in current treatment at this time. Obesity with starting BMI of 34.1  BMI 32.0-32.9,adult    Diet: Sussan is currently in the action stage of change. As such, her goal is to continue with weight loss efforts and has agreed to keeping a food journal and adhering to recommended goals of 1100-1200 calories and 85 or more grams of protein daily.   Exercise:  For substantial health benefits, adults should do at least 150 minutes (2 hours and 30 minutes) a week of moderate-intensity, or 75 minutes (1 hour and 15 minutes) a week of vigorous-intensity aerobic physical activity, or an equivalent combination of moderate- and vigorous-intensity aerobic activity. Aerobic activity should be performed in episodes of at least 10 minutes, and preferably, it should be spread throughout the week.  Behavior Modification:  We discussed the following Behavioral Modification Strategies today: increasing lean protein intake, decreasing simple carbohydrates, increasing vegetables, meal planning and cooking strategies, planning for success, and keep a strict food journal.   Return in about 5 weeks (around 07/17/2024).   She was informed of  the importance of frequent follow up visits to maximize her success with intensive lifestyle modifications for her multiple health conditions.  Attestation Statements:   Reviewed by clinician on day of visit: allergies, medications, problem list, medical history, surgical history, family history, social history, and previous encounter notes.   Adelita Cho, MD

## 2024-06-18 NOTE — Assessment & Plan Note (Signed)
 Doing well on vitamin D  supplementation.  No change in current treatment at this time.

## 2024-06-18 NOTE — Assessment & Plan Note (Signed)
 Patient is tolerating metformin  and needs a refill today. Increase dosage as patient is not experiencing any side effects but is having cravings.  Will follow-up at next appointment as to whether or not patient's hunger or cravings are controlled and the patient is experiencing side effects may need to go to twice a day dosage.

## 2024-07-24 ENCOUNTER — Ambulatory Visit (INDEPENDENT_AMBULATORY_CARE_PROVIDER_SITE_OTHER): Payer: Self-pay | Admitting: Family Medicine

## 2024-07-24 VITALS — BP 150/98 | HR 58 | Temp 98.2°F | Ht 62.0 in | Wt 181.0 lb

## 2024-07-24 DIAGNOSIS — R7303 Prediabetes: Secondary | ICD-10-CM | POA: Diagnosis not present

## 2024-07-24 DIAGNOSIS — I10 Essential (primary) hypertension: Secondary | ICD-10-CM | POA: Diagnosis not present

## 2024-07-24 DIAGNOSIS — E669 Obesity, unspecified: Secondary | ICD-10-CM | POA: Diagnosis not present

## 2024-07-24 DIAGNOSIS — Z6833 Body mass index (BMI) 33.0-33.9, adult: Secondary | ICD-10-CM

## 2024-07-24 DIAGNOSIS — E66811 Obesity, class 1: Secondary | ICD-10-CM

## 2024-07-24 NOTE — Progress Notes (Unsigned)
 SUBJECTIVE:  Chief Complaint: Obesity  Interim History: patient has been mostly working over the last 6 weeks.  She has been consistent with her two jobs.  She has been trying to cook her own foods since her last appointment.  She is trying to do better.  She barely indulged over the Thanksgiving holiday.  She has been leaning heavily on the [employer] keto sandwich bread and sweet potato fries and broccoli.    Kayla [name] is here to discuss her progress with her obesity treatment plan. She is on the keeping a food journal and adhering to recommended goals of 1100-1200 calories and 85 grams of protein and states she is following her eating plan approximately 75 % of the time. She states she is not exercising.  OBJECTIVE: Visit Diagnoses: Problem List Items Addressed This Visit   None   Vitals Temp: 98.2 F (36.8 C) BP: (!) 150/98 Pulse Rate: (!) 58 SpO2: 97 %   Anthropometric Measurements Height: 5' 2 (1.575 m) Weight: 181 lb (82.1 kg) BMI (Calculated): 33.1 Weight at Last Visit: 178 lb Weight Gained Since Last Visit: 3 Starting Weight: 181 lb Total Weight Loss (lbs): 0 lb (0 kg)   Body Composition  Body Fat %: 40.5 % Fat Mass (lbs): 73.4 lbs Muscle Mass (lbs): 102.4 lbs Total Body Water (lbs): 74.8 lbs Visceral Fat Rating : 10   Other Clinical Data Today's Visit #: 19 Starting Date: 11/04/22 Comments: 1100-1200/85     ASSESSMENT AND PLAN: Assessment & Plan Prediabetes Patient has been making mindful choices when it comes to dietary intake.  She is trying to focus on total protein intake.  We discussed at length today the nutritional validity of the foods that she is taking in and the 10:1 ratio of 10 cal to 1 g of protein.  While patient is often within her calorie budget she is low on protein or she is reaching her protein goal and slightly elevated in her total calories.  She is interested in possibly pursuing a scale that she saw on TikTok that will give her the  nutritional values of food by scanning a barcode.  She feels as though this may help her better log and have a better idea of what she is taking and overall over the course of the day.  Will follow-up in 2 months to see how patient has fared with food logging and mindful eating and if she has been more capable of hitting nutritional goals. Primary hypertension Blood pressure elevated today.  Patient denies feeling stressed or anxious.  Prior blood pressures have been elevated.  Patient has a prescription blood pressure medication that she was given previously but was taken off of due to blood pressure control.  Patient reports that if she checks her blood pressure at work it is often within normal limits.  Will need to follow-up on blood pressures at the next appointment and patient was encouraged to continue to monitor blood pressures and report any symptoms such as chest pain, chest pressure, or headache BMI 33.0-33.9,adult  Obesity with starting BMI of 34.1    Diet: Kayla [name] is currently in the action stage of change. As such, her goal is to continue with weight loss efforts and has agreed to keeping a food journal and adhering to recommended goals of 1200 calories and 85 or more grams of protein.   Exercise:  For substantial health benefits, adults should do at least 150 minutes (2 hours and 30 minutes) a week of moderate-intensity,  or 75 minutes (1 hour and 15 minutes) a week of vigorous-intensity aerobic physical activity, or an equivalent combination of moderate- and vigorous-intensity aerobic activity. Aerobic activity should be performed in episodes of at least 10 minutes, and preferably, it should be spread throughout the week.  Behavior Modification:  We discussed the following Behavioral Modification Strategies today: increasing lean protein intake, decreasing simple carbohydrates, better snacking choices, planning for success, and keep a strict food journal.   Return in about 6 weeks  (around 09/04/2024).   She was informed of the importance of frequent follow up visits to maximize her success with intensive lifestyle modifications for her multiple health conditions.  Attestation Statements:   Reviewed by clinician on day of visit: allergies, medications, problem list, medical history, surgical history, family history, social history, and previous encounter notes.   I personally spent a total of 30 minutes in the care of the patient today including preparing to see the patient, getting/reviewing separately obtained history, and counseling and educating.   Kayla Cho, MD

## 2024-07-25 NOTE — Assessment & Plan Note (Signed)
 Patient has been making mindful choices when it comes to dietary intake.  She is trying to focus on total protein intake.  We discussed at length today the nutritional validity of the foods that she is taking in and the 10:1 ratio of 10 cal to 1 g of protein.  While patient is often within her calorie budget she is low on protein or she is reaching her protein goal and slightly elevated in her total calories.  She is interested in possibly pursuing a scale that she saw on TikTok that will give her the nutritional values of food by scanning a barcode.  She feels as though this may help her better log and have a better idea of what she is taking and overall over the course of the day.  Will follow-up in 2 months to see how patient has fared with food logging and mindful eating and if she has been more capable of hitting nutritional goals.

## 2024-10-04 ENCOUNTER — Ambulatory Visit (INDEPENDENT_AMBULATORY_CARE_PROVIDER_SITE_OTHER): Admitting: Family Medicine
